# Patient Record
Sex: Male | Born: 1955
Health system: Southern US, Community
[De-identification: ages and names within clinical notes are randomized; demographics above are authoritative.]

## PROBLEM LIST (undated history)

## (undated) DIAGNOSIS — I10 Essential (primary) hypertension: Secondary | ICD-10-CM

## (undated) DIAGNOSIS — E785 Hyperlipidemia, unspecified: Secondary | ICD-10-CM

## (undated) DIAGNOSIS — E559 Vitamin D deficiency, unspecified: Secondary | ICD-10-CM

## (undated) DIAGNOSIS — G40909 Epilepsy, unspecified, not intractable, without status epilepticus: Secondary | ICD-10-CM

## (undated) DIAGNOSIS — J438 Other emphysema: Secondary | ICD-10-CM

## (undated) DIAGNOSIS — F1096 Alcohol use, unspecified with alcohol-induced persisting amnestic disorder: Secondary | ICD-10-CM

## (undated) DIAGNOSIS — T7840XA Allergy, unspecified, initial encounter: Secondary | ICD-10-CM

## (undated) DIAGNOSIS — F1011 Alcohol abuse, in remission: Secondary | ICD-10-CM

## (undated) DIAGNOSIS — G969 Disorder of central nervous system, unspecified: Secondary | ICD-10-CM

## (undated) DIAGNOSIS — E539 Vitamin B deficiency, unspecified: Secondary | ICD-10-CM

## (undated) HISTORY — DX: Other emphysema: J43.8

## (undated) HISTORY — DX: Essential (primary) hypertension: I10

## (undated) HISTORY — DX: Epilepsy, unspecified, not intractable, without status epilepticus: G40.909

## (undated) HISTORY — DX: Alcohol use, unspecified with alcohol-induced persisting amnestic disorder: F10.96

## (undated) HISTORY — DX: Alcohol abuse, in remission: F10.11

## (undated) HISTORY — DX: Disorder of central nervous system, unspecified: G96.9

## (undated) HISTORY — DX: Vitamin D deficiency, unspecified: E55.9

## (undated) HISTORY — DX: Hyperlipidemia, unspecified: E78.5

## (undated) HISTORY — DX: Allergy, unspecified, initial encounter: T78.40XA

## (undated) HISTORY — DX: Vitamin B deficiency, unspecified: E53.9

## (undated) HISTORY — PX: VASECTOMY: SHX75

---

## 2006-07-19 ENCOUNTER — Emergency Department: Payer: Self-pay | Admitting: Emergency Medicine

## 2009-04-12 DIAGNOSIS — I10 Essential (primary) hypertension: Secondary | ICD-10-CM | POA: Insufficient documentation

## 2009-04-12 DIAGNOSIS — J301 Allergic rhinitis due to pollen: Secondary | ICD-10-CM | POA: Insufficient documentation

## 2009-04-12 DIAGNOSIS — J449 Chronic obstructive pulmonary disease, unspecified: Secondary | ICD-10-CM | POA: Insufficient documentation

## 2013-11-21 ENCOUNTER — Inpatient Hospital Stay: Payer: Self-pay | Admitting: Internal Medicine

## 2013-11-21 LAB — CBC WITH DIFFERENTIAL/PLATELET
Basophil #: 0.1 10*3/uL (ref 0.0–0.1)
Basophil %: 0.9 %
Eosinophil #: 0.1 10*3/uL (ref 0.0–0.7)
Eosinophil %: 1.1 %
HCT: 58.6 % — AB (ref 40.0–52.0)
HGB: 19.7 g/dL — AB (ref 13.0–18.0)
Lymphocyte #: 1.4 10*3/uL (ref 1.0–3.6)
Lymphocyte %: 12.1 %
MCH: 32.7 pg (ref 26.0–34.0)
MCHC: 33.6 g/dL (ref 32.0–36.0)
MCV: 97 fL (ref 80–100)
MONO ABS: 1.8 x10 3/mm — AB (ref 0.2–1.0)
Monocyte %: 15.3 %
NEUTROS ABS: 8.4 10*3/uL — AB (ref 1.4–6.5)
NEUTROS PCT: 70.6 %
Platelet: 131 10*3/uL — ABNORMAL LOW (ref 150–440)
RBC: 6.02 10*6/uL — ABNORMAL HIGH (ref 4.40–5.90)
RDW: 12.7 % (ref 11.5–14.5)
WBC: 11.8 10*3/uL — ABNORMAL HIGH (ref 3.8–10.6)

## 2013-11-21 LAB — COMPREHENSIVE METABOLIC PANEL
ALBUMIN: 2.9 g/dL — AB (ref 3.4–5.0)
Alkaline Phosphatase: 115 U/L
Anion Gap: 10 (ref 7–16)
BUN: 6 mg/dL — ABNORMAL LOW (ref 7–18)
Bilirubin,Total: 3.2 mg/dL — ABNORMAL HIGH (ref 0.2–1.0)
CALCIUM: 8.8 mg/dL (ref 8.5–10.1)
Chloride: 95 mmol/L — ABNORMAL LOW (ref 98–107)
Co2: 24 mmol/L (ref 21–32)
Creatinine: 0.71 mg/dL (ref 0.60–1.30)
EGFR (African American): >60
EGFR (Non-African Amer.): >60
Glucose: 121 mg/dL — ABNORMAL HIGH (ref 65–99)
Osmolality: 258 (ref 275–301)
Potassium: 3.7 mmol/L (ref 3.5–5.1)
SGOT(AST): 78 U/L — ABNORMAL HIGH (ref 15–37)
SGPT (ALT): 71 U/L (ref 12–78)
SODIUM: 129 mmol/L — AB (ref 136–145)
Total Protein: 6.3 g/dL — ABNORMAL LOW (ref 6.4–8.2)

## 2013-11-21 LAB — URINALYSIS, COMPLETE
BLOOD: NEGATIVE
Bacteria: NEGATIVE
GLUCOSE, UR: NEGATIVE mg/dL (ref 0–75)
Ketone: NEGATIVE
Leukocyte Esterase: NEGATIVE
Nitrite: NEGATIVE
PH: 5 (ref 4.5–8.0)
Protein: 30
RBC,UR: NONE SEEN /HPF (ref 0–5)
Specific Gravity: 1.017 (ref 1.003–1.030)

## 2013-11-21 LAB — AMMONIA: AMMONIA, PLASMA: 14 umol/L (ref 11–32)

## 2013-11-21 LAB — SEDIMENTATION RATE: Erythrocyte Sed Rate: 1 mm/hr (ref 0–20)

## 2013-11-22 LAB — BASIC METABOLIC PANEL
Anion Gap: 5 — ABNORMAL LOW (ref 7–16)
BUN: 4 mg/dL — ABNORMAL LOW (ref 7–18)
CALCIUM: 7.8 mg/dL — AB (ref 8.5–10.1)
CHLORIDE: 101 mmol/L (ref 98–107)
CO2: 25 mmol/L (ref 21–32)
CREATININE: 0.59 mg/dL — AB (ref 0.60–1.30)
EGFR (African American): >60
EGFR (Non-African Amer.): >60
Glucose: 97 mg/dL (ref 65–99)
Osmolality: 259 (ref 275–301)
Potassium: 3.1 mmol/L — ABNORMAL LOW (ref 3.5–5.1)
SODIUM: 131 mmol/L — AB (ref 136–145)

## 2013-11-22 LAB — CBC WITH DIFFERENTIAL/PLATELET
BASOS ABS: 0.1 10*3/uL (ref 0.0–0.1)
Basophil %: 1.1 %
Eosinophil #: 0.1 10*3/uL (ref 0.0–0.7)
Eosinophil %: 1.4 %
HCT: 48.4 % (ref 40.0–52.0)
HGB: 16.7 g/dL (ref 13.0–18.0)
Lymphocyte #: 1.4 10*3/uL (ref 1.0–3.6)
Lymphocyte %: 15.8 %
MCH: 33.9 pg (ref 26.0–34.0)
MCHC: 34.5 g/dL (ref 32.0–36.0)
MCV: 98 fL (ref 80–100)
MONOS PCT: 13.4 %
Monocyte #: 1.2 x10 3/mm — ABNORMAL HIGH (ref 0.2–1.0)
NEUTROS ABS: 6.2 10*3/uL (ref 1.4–6.5)
Neutrophil %: 68.3 %
Platelet: 110 10*3/uL — ABNORMAL LOW (ref 150–440)
RBC: 4.93 10*6/uL (ref 4.40–5.90)
RDW: 12.8 % (ref 11.5–14.5)
WBC: 9.1 10*3/uL (ref 3.8–10.6)

## 2013-11-22 LAB — LIPID PANEL
Cholesterol: 108 mg/dL (ref 0–200)
HDL Cholesterol: 56 mg/dL (ref 40–60)
LDL CHOLESTEROL, CALC: 41 mg/dL (ref 0–100)
TRIGLYCERIDES: 54 mg/dL (ref 0–200)
VLDL CHOLESTEROL, CALC: 11 mg/dL (ref 5–40)

## 2013-11-22 LAB — MAGNESIUM: Magnesium: 1.5 mg/dL — ABNORMAL LOW

## 2013-11-23 LAB — BASIC METABOLIC PANEL
ANION GAP: 7 (ref 7–16)
BUN: 3 mg/dL — AB (ref 7–18)
CO2: 25 mmol/L (ref 21–32)
Calcium, Total: 8.2 mg/dL — ABNORMAL LOW (ref 8.5–10.1)
Chloride: 106 mmol/L (ref 98–107)
Creatinine: 0.59 mg/dL — ABNORMAL LOW (ref 0.60–1.30)
EGFR (African American): >60
GLUCOSE: 83 mg/dL (ref 65–99)
OSMOLALITY: 271 (ref 275–301)
Potassium: 3.3 mmol/L — ABNORMAL LOW (ref 3.5–5.1)
SODIUM: 138 mmol/L (ref 136–145)

## 2013-11-23 LAB — PLATELET COUNT: PLATELETS: 109 10*3/uL — AB (ref 150–440)

## 2013-11-23 LAB — MAGNESIUM: MAGNESIUM: 1.6 mg/dL — AB

## 2013-11-25 LAB — BASIC METABOLIC PANEL
Anion Gap: 6 — ABNORMAL LOW (ref 7–16)
BUN: 3 mg/dL — AB (ref 7–18)
CREATININE: 0.63 mg/dL (ref 0.60–1.30)
Calcium, Total: 8.3 mg/dL — ABNORMAL LOW (ref 8.5–10.1)
Chloride: 105 mmol/L (ref 98–107)
Co2: 27 mmol/L (ref 21–32)
EGFR (African American): >60
EGFR (Non-African Amer.): >60
Glucose: 87 mg/dL (ref 65–99)
Osmolality: 272 (ref 275–301)
Potassium: 3.5 mmol/L (ref 3.5–5.1)
Sodium: 138 mmol/L (ref 136–145)

## 2013-11-25 LAB — MAGNESIUM: Magnesium: 1.8 mg/dL

## 2013-11-26 LAB — CBC WITH DIFFERENTIAL/PLATELET
BASOS PCT: 1.4 %
Basophil #: 0.1 10*3/uL (ref 0.0–0.1)
Eosinophil #: 0.3 10*3/uL (ref 0.0–0.7)
Eosinophil %: 3.3 %
HCT: 47.6 % (ref 40.0–52.0)
HGB: 16.5 g/dL (ref 13.0–18.0)
LYMPHS ABS: 1.3 10*3/uL (ref 1.0–3.6)
Lymphocyte %: 13.3 %
MCH: 34 pg (ref 26.0–34.0)
MCHC: 34.6 g/dL (ref 32.0–36.0)
MCV: 98 fL (ref 80–100)
MONOS PCT: 15.4 %
Monocyte #: 1.5 x10 3/mm — ABNORMAL HIGH (ref 0.2–1.0)
Neutrophil #: 6.4 10*3/uL (ref 1.4–6.5)
Neutrophil %: 66.6 %
RBC: 4.84 10*6/uL (ref 4.40–5.90)
RDW: 12.9 % (ref 11.5–14.5)
WBC: 9.6 10*3/uL (ref 3.8–10.6)

## 2013-11-26 LAB — PLATELET COUNT: PLATELETS: 158 10*3/uL (ref 150–440)

## 2013-11-27 LAB — AMMONIA: Ammonia, Plasma: 19 mcmol/L (ref 11–32)

## 2013-11-27 LAB — HEPATIC FUNCTION PANEL A (ARMC)
ALBUMIN: 2.2 g/dL — AB (ref 3.4–5.0)
ALT: 39 U/L (ref 12–78)
AST: 24 U/L (ref 15–37)
Alkaline Phosphatase: 77 U/L
Bilirubin, Direct: 0.2 mg/dL (ref 0.00–0.20)
Bilirubin,Total: 0.5 mg/dL (ref 0.2–1.0)
TOTAL PROTEIN: 5.1 g/dL — AB (ref 6.4–8.2)

## 2013-11-27 LAB — FOLATE: Folic Acid: 17.9 ng/mL (ref 3.1–100.0)

## 2013-11-27 LAB — TSH: Thyroid Stimulating Horm: 3.07 u[IU]/mL

## 2013-11-28 LAB — CREATININE, SERUM
CREATININE: 0.68 mg/dL (ref 0.60–1.30)
EGFR (African American): >60

## 2014-01-10 ENCOUNTER — Ambulatory Visit: Payer: Self-pay | Admitting: Neurology

## 2014-01-10 ENCOUNTER — Emergency Department: Payer: Self-pay | Admitting: Emergency Medicine

## 2014-01-10 LAB — CBC
HCT: 47.3 % (ref 40.0–52.0)
HGB: 16.1 g/dL (ref 13.0–18.0)
MCH: 30.9 pg (ref 26.0–34.0)
MCHC: 34.1 g/dL (ref 32.0–36.0)
MCV: 91 fL (ref 80–100)
Platelet: 258 10*3/uL (ref 150–440)
RBC: 5.22 10*6/uL (ref 4.40–5.90)
RDW: 13.1 % (ref 11.5–14.5)
WBC: 8.7 10*3/uL (ref 3.8–10.6)

## 2014-01-10 LAB — MAGNESIUM: MAGNESIUM: 1.7 mg/dL — AB

## 2014-01-10 LAB — URINALYSIS, COMPLETE
BACTERIA: NONE SEEN
BLOOD: NEGATIVE
Bilirubin,UR: NEGATIVE
Glucose,UR: NEGATIVE mg/dL (ref 0–75)
Ketone: NEGATIVE
Leukocyte Esterase: NEGATIVE
Nitrite: NEGATIVE
Ph: 6 (ref 4.5–8.0)
Protein: NEGATIVE
RBC,UR: <1 /HPF (ref 0–5)
Specific Gravity: 1.002 (ref 1.003–1.030)
Squamous Epithelial: NONE SEEN
WBC UR: 1 /HPF (ref 0–5)

## 2014-01-10 LAB — TSH: THYROID STIMULATING HORM: 3.71 u[IU]/mL

## 2014-01-10 LAB — COMPREHENSIVE METABOLIC PANEL
ALK PHOS: 71 U/L
ALT: 21 U/L (ref 12–78)
ANION GAP: 10 (ref 7–16)
AST: 16 U/L (ref 15–37)
Albumin: 3.4 g/dL (ref 3.4–5.0)
BUN: 2 mg/dL — AB (ref 7–18)
Bilirubin,Total: 0.7 mg/dL (ref 0.2–1.0)
CALCIUM: 9.1 mg/dL (ref 8.5–10.1)
CHLORIDE: 105 mmol/L (ref 98–107)
Co2: 25 mmol/L (ref 21–32)
Creatinine: 0.8 mg/dL (ref 0.60–1.30)
EGFR (African American): >60
Glucose: 97 mg/dL (ref 65–99)
Osmolality: 276 (ref 275–301)
Potassium: 3.6 mmol/L (ref 3.5–5.1)
Sodium: 140 mmol/L (ref 136–145)
TOTAL PROTEIN: 6.8 g/dL (ref 6.4–8.2)

## 2014-01-10 LAB — TROPONIN I: Troponin-I: <0.02 ng/mL

## 2014-01-10 LAB — PHOSPHORUS: Phosphorus: 3.4 mg/dL (ref 2.5–4.9)

## 2014-01-10 LAB — AMMONIA: Ammonia, Plasma: 25 mcmol/L (ref 11–32)

## 2014-07-05 LAB — PSA: PSA: NORMAL

## 2014-07-05 LAB — LIPID PANEL
Cholesterol: 197 mg/dL (ref 0–200)
HDL: 39 mg/dL (ref 35–70)
LDL Cholesterol: 131 mg/dL
TRIGLYCERIDES: 133 mg/dL (ref 40–160)

## 2014-12-27 IMAGING — CT CT HEAD WITHOUT CONTRAST
1 series · 16 of 30 positions shown, 20 images · non-contrast
Comparison: 11/25/2013 MR.  11/21/2013 CT.

CLINICAL DATA: Altered mental status. Thiamine deficiency. High
blood pressure.

EXAM:
CT HEAD WITHOUT CONTRAST
TECHNIQUE: Contiguous axial images were obtained from the base of the skull
through the vertex without intravenous contrast.

[Series 2: head wo · axial · 0.41mm/px · z∈[+454,+589]mm · 16 of 34 slices shown, 20 images]
[im 2/34  brain]
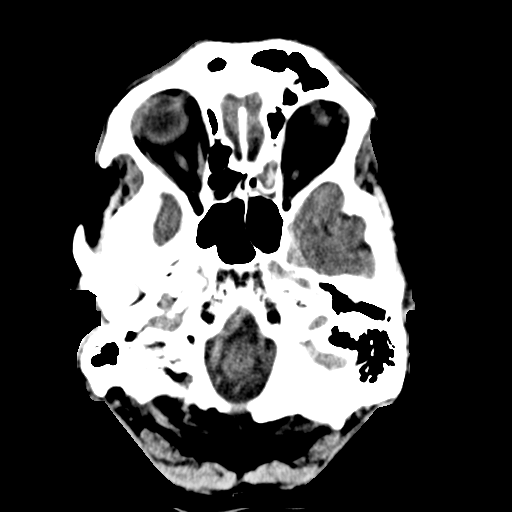
[im 2/34  bone]
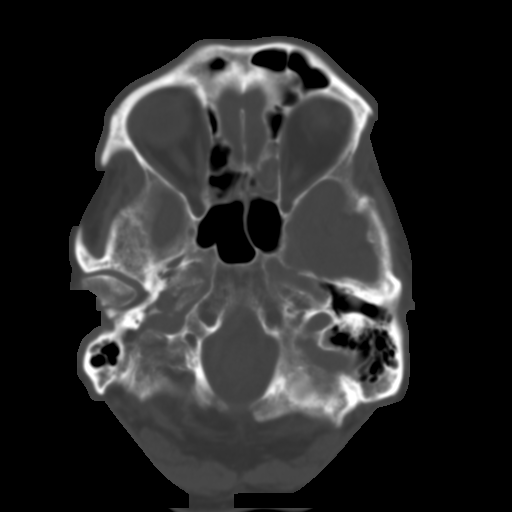
[im 4/34  brain]
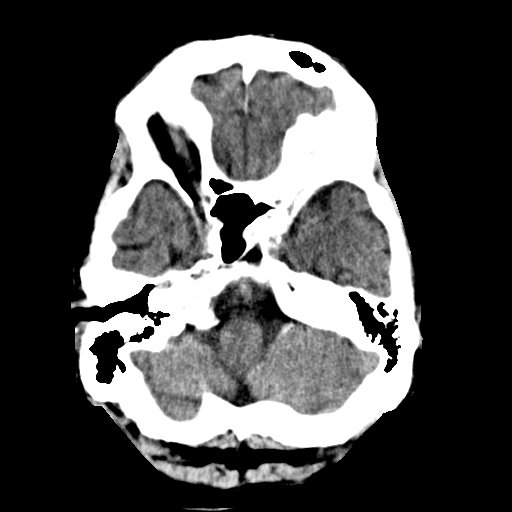
[im 6/34  brain]
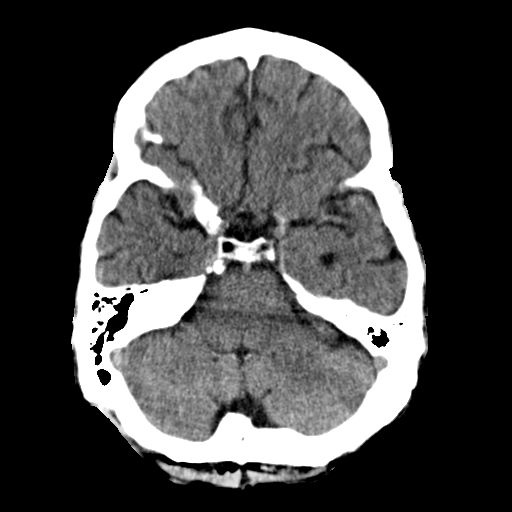
[im 8/34  brain]
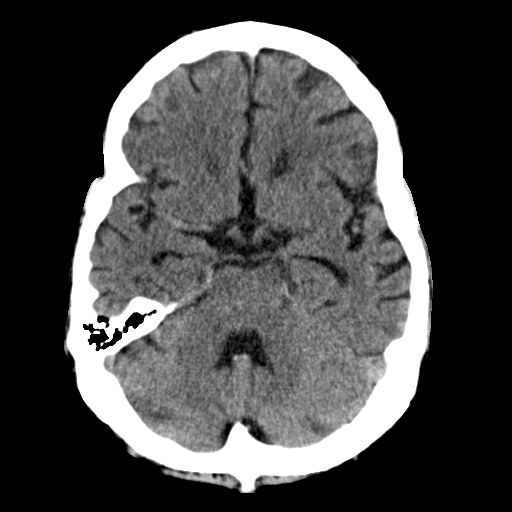
[im 10/34  brain]
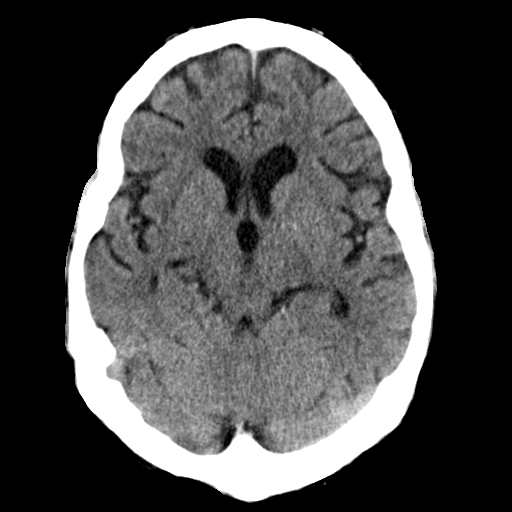
[im 10/34  bone]
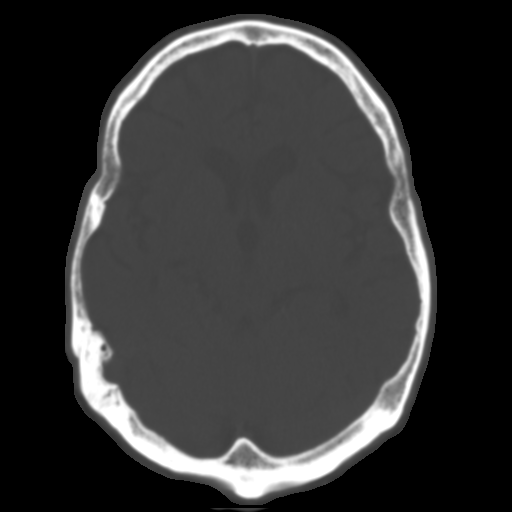
[im 12/34  brain]
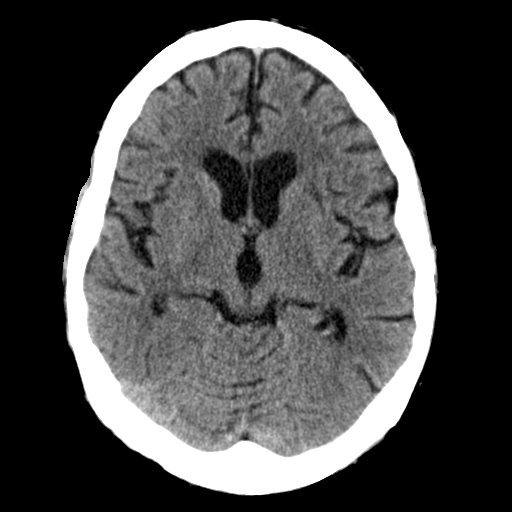
[im 14/34  brain]
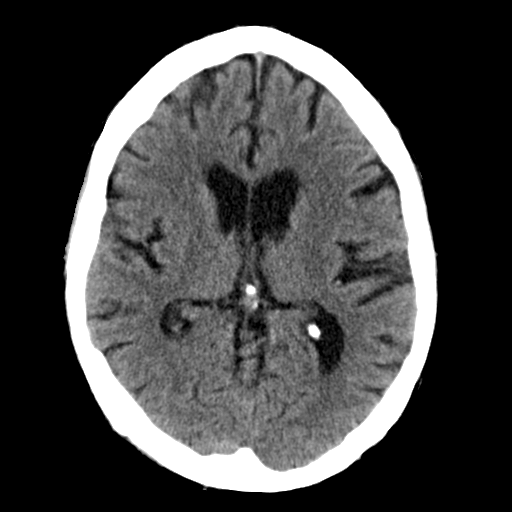
[im 16/34  brain]
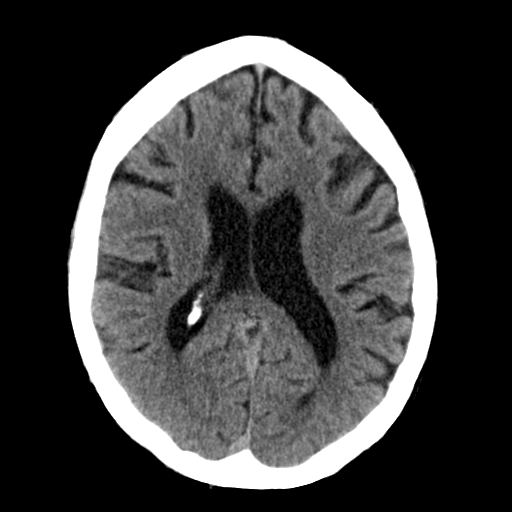
[im 18/34  brain]
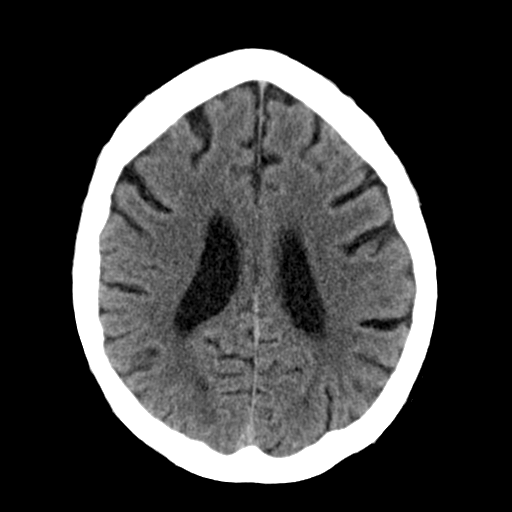
[im 18/34  bone]
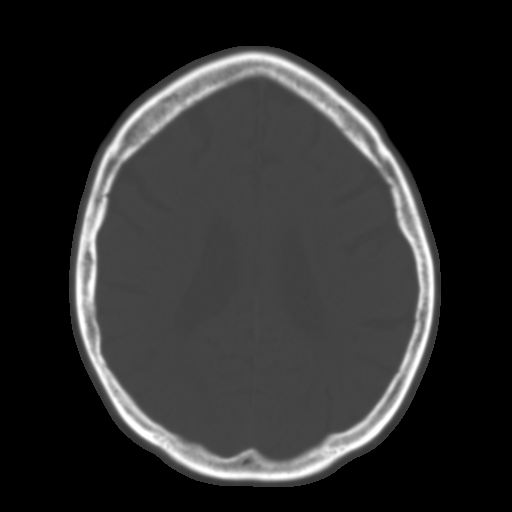
[im 20/34  brain]
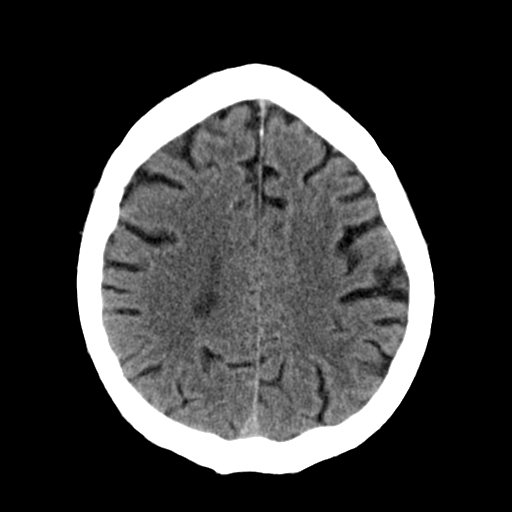
[im 22/34  brain]
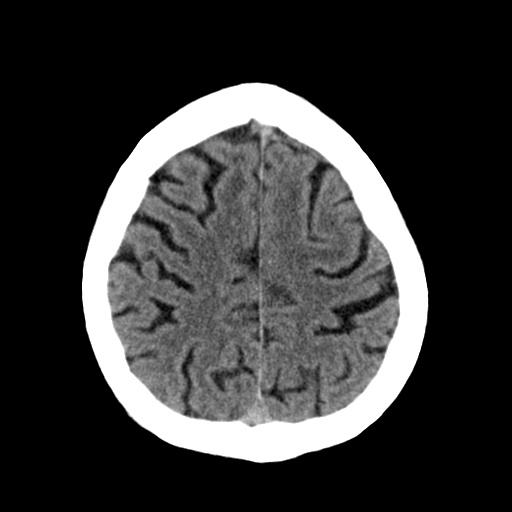
[im 24/34  brain]
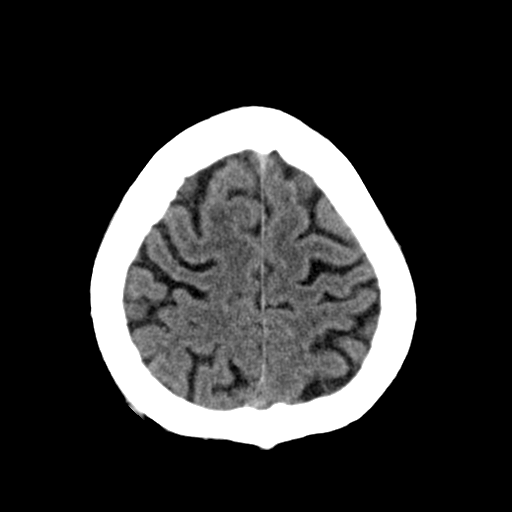
[im 26/34  brain]
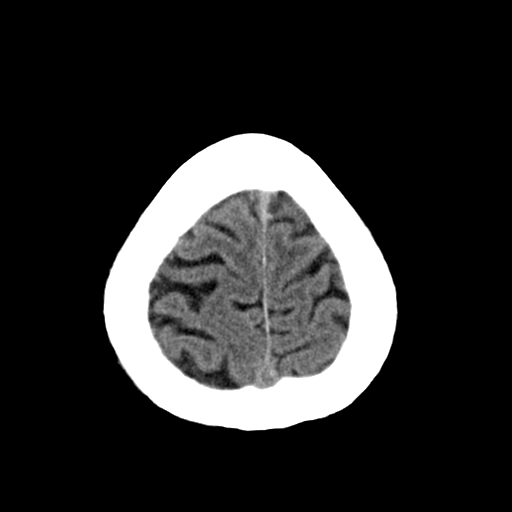
[im 26/34  bone]
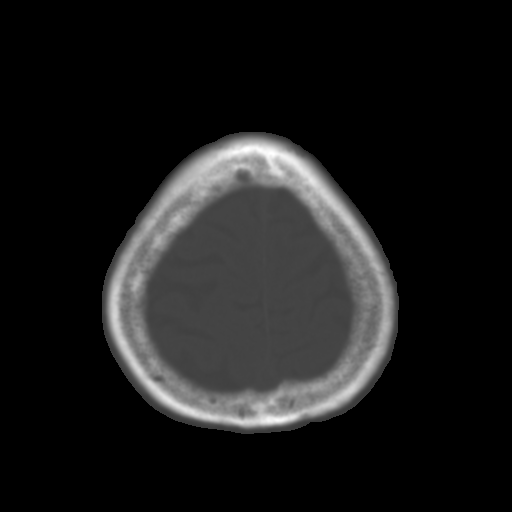
[im 28/34  brain]
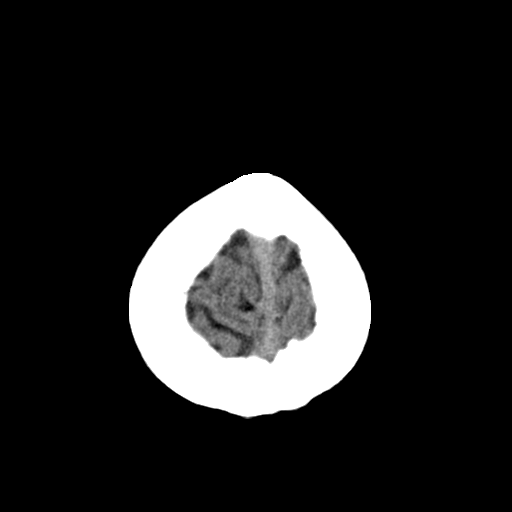
[im 30/34  brain]
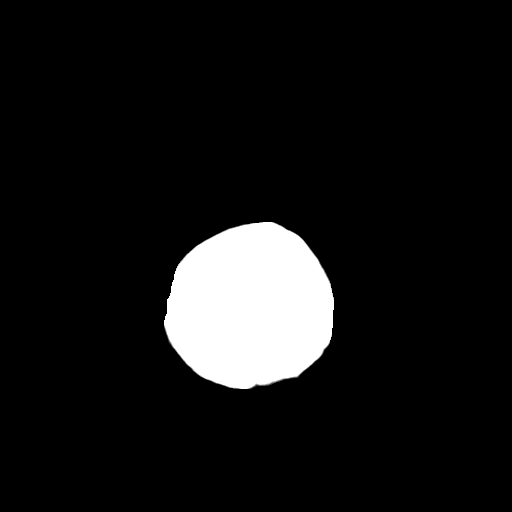
[im 32/34  brain]
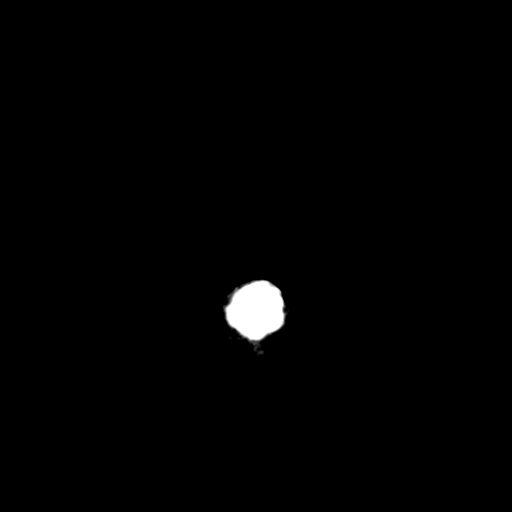

[16 of 30 positions shown; findings below may reference images not displayed]

FINDINGS: No intracranial hemorrhage.

No CT evidence of large acute infarct.

No intracranial mass lesion noted on this unenhanced exam.

Mild nonspecific white matter type changes.

Mild atrophy without hydrocephalus.

Partial opacification/ mucosal thickening ethmoid sinus air cells.

No CT findings of Wernicke's encephalopathy. This is better detected
by MR imaging.
IMPRESSION: Mild nonspecific white matter type changes.  Please see above.

## 2015-02-04 NOTE — Consult Note (Signed)
Referring Physician:  Remer Macho   Primary Care Physician:  Idelle Crouch [facility], [address] [place], [place] [address], [place] [phone]  Reason for Consult: Admit Date: 21-Nov-2013  Chief Complaint: encephalopathy  Reason for Consult: encephalopathy   History of Present Illness: History of Present Illness:   The patient is a 59 year old male with a history of hypertension and tobacco abuse, who presents with a 3 to 4 day history of intractable nausea, vomiting, and now mental status changes. He is confused. Poor memory of acute onset. Brought to the Emergency Room, where the patient's initial head CT was unremarkable. He was noted to be polycythemic and dehydrated, with hyponatremia. He is afebrile. He is now admitted for further evaluation.  friend told me that he makes things up (e.g. described an episode how he [place] - which in reality did not happen, pt thinks that he still works - when he retired more than 6 months ago.) walks wobbly. heavy alcohol use as above. MEDICAL HISTORY: Benign hypertension.  Tobacco abuse.  HISTORY: The patient is married. Smokes at least 1 pack per day. No history of alcohol abuse.  HISTORY: Positive for stroke, hypertension, coronary artery disease, and dementia.    ROS:  General fatigue    HEENT no complaints    Lungs no complaints    Cardiac no complaints    GI nausea/vomiting    GU no complaints    Musculoskeletal muscle ache    Extremities no complaints    Skin no complaints    Neuro memory issues    Endocrine no complaints    Psych no complaints    Past Medical/Surgical Hx:  depression: per chart notes  alcohol dependence: per chart notes  Hypertension:   Home Medications: Medication Instructions Last Modified Date/Time  verapamil 240 mg/12 hours oral tablet, extended release 1 tab(s) orally once a day (in the morning) 08-Feb-15 17:55  Fish Oil 1000 mg oral capsule 1 cap(s)  orally 1 to 2 times a day 08-Feb-15 17:55   Allergies:  No Known Allergies:   Vital Signs: **Vital Signs.:   16-Feb-15 12:55  Vital Signs Type Q 4hr  Temperature Temperature (F) 97.9  Celsius 36.6  Pulse Pulse 86  Respirations Respirations 20  Systolic BP Systolic BP 122  Diastolic BP (mmHg) Diastolic BP (mmHg) 73  Mean BP 89  Pulse Ox % Pulse Ox % 92  Pulse Ox Activity Level  At rest  Oxygen Delivery Room Air/ 21 %   EXAM: General Exam Patient looks appropriate of age, well built, nourished and appropriately groomed in hospital clothes.   Cardiovascular Exam: S1, S2 heart sounds present Carotid exam revealed no bruit Lung exam was clear to auscultation belly soft  Neurological Exam      Mental Status:      Alert, not oriented (03/26/2011), wednesday, Obama - bush - ?, 3/3 - 0/3, couldn't count months of the year backward, thought my exam tools were electrician tools.   Attention span and concentration seemed decreased.     Intact naming, repetition, comprehension.       Followed 2 step commands - no dysarthria +ve palmomental, palmer and visual grasp reflex.       Cranial Nerves:      Olfactory and vagus nerves not are examined      Visual fields were full      Pupils were equal, round and reactive to light and accommodation  Extra-ocular movements were slow, sccadic, end gaze nystagmus      Facial sensations are normal      Face is symmetric      Finger rub was heard symmetric in both ears      Palate and uvular movements are normal and oral sensations are OK      Neck muscle strength and shoulder shrug is normal      Tongue protrusion and uvular elevation are normal       Motor Exam:      Tone is normal in all extremities      Muscle strength in all extremities is 5/5. bil asterixis.       Deep Tendon Reflexes:      symmetric 2 +      Right Toes are down going,  Left Toes are down going            Sensory Exam:      Sensations were intact to light touch  in all extremities           Co-ordination:      Finger to nose showed mild inco-ordination.            Gait:      Gait and station seemed OK..  Lab Results:  Thyroid:  14-Feb-15 12:33   Thyroid Stimulating Hormone 3.07 (0.45-4.50 (International Unit)  ----------------------- Pregnant patients have  different reference  ranges for TSH:  - - - - - - - - - -  Pregnant, first trimetser:  0.36 - 2.50 uIU/mL)  Hepatic:  14-Feb-15 04:36   Bilirubin, Total 0.5  Bilirubin, Direct 0.20 (Result(s) reported on 27 Nov 2013 at 05:26AM.)  Alkaline Phosphatase 77 (45-117 NOTE: New Reference Range 09/03/13)  SGPT (ALT) 39  SGOT (AST) 24  Total Protein, Serum  5.1  Albumin, Serum  2.2  General Ref:  14-Feb-15 04:36   Hepatitis Panel A, B, C ========== TEST NAME ==========  ========= RESULTS =========  = REFERENCE RANGE =  HEPATITIS PANEL A, B, C  HP5+HAVIgM+HBcIgM Hep A Ab, IgM                   [   Negative             ]          Negative Hep A Ab, Total                 [   Negative             ]          Negative HBsAg Screen                    [   Negative             ]          Negative Hep B Core Ab, IgM              [   Negative             ]          Negative Hep B Core Ab, Tot              [   Negative             ]         Negative Hep B Surface Ab, Qual          [  Non Reactive         ]                                                Non Reactive: Inconsistent with immunity,                                            less than 10 mIU/mL            Reactive:     Consistent with immunity,                                            greater than 9.9 mIU/mL HCV Ab                          [   <0.1 s/co ratio      ]           0.0-0.9 Comment:                        [   Final Report    ]                   Non reactive HCV antibody screen is consistent with no HCV infection, unless recent infection is suspected or other evidence exists to indicate HCV infection.                [facility]            No: [id number]          [address], [place], [place] [address]           Lindon Romp, MD         [phone]   Result(s) reported on 28 Nov 2013 at 10:49PM.  Routine Chem:  09-Feb-15 04:26   Cholesterol, Serum 108  Triglycerides, Serum 54  HDL (INHOUSE) 56  VLDL Cholesterol Calculated 11  LDL Cholesterol Calculated 41 (Result(s) reported on 22 Nov 2013 at 05:13AM.)  12-Feb-15 04:09   Glucose, Serum 87  BUN  3  Sodium, Serum 138  Potassium, Serum 3.5  Chloride, Serum 105  CO2, Serum 27  Calcium (Total), Serum  8.3  Anion Gap  6  Osmolality (calc) 272  Magnesium, Serum 1.8 (1.8-2.4 THERAPEUTIC RANGE: 4-7 mg/dL TOXIC: > 10 mg/dL  -----------------------)  14-Feb-15 12:33   Ammonia, Plasma 19 (Result(s) reported on 27 Nov 2013 at 07:12RF.)  Folic Acid, Serum 75.8 (Result(s) reported on 27 Nov 2013 at 01:23PM.)  15-Feb-15 06:02   Creatinine (comp) 0.68  eGFR (African American) >60  eGFR (Non-African American) >60 (eGFR values <83m/min/1.73 m2 may be an indication of chronic kidney disease (CKD). Calculated eGFR is useful in patients with stable renal function. The eGFR calculation will not be reliable in acutely ill patients when serum creatinine is changing rapidly. It is not useful in  patients on dialysis. The eGFR calculation may not be applicable to patients at the low and high extremes of body sizes, pregnant women, and vegetarians.)  Routine UA:  08-Feb-15 11:09  Color (UA) Amber  Clarity (UA) Clear  Glucose (UA) Negative  Bilirubin (UA) 1+  Ketones (UA) Negative  Specific Gravity (UA) 1.017  Blood (UA) Negative  pH (UA) 5.0  Protein (UA) 30 mg/dL  Nitrite (UA) Negative  Leukocyte Esterase (UA) Negative (Result(s) reported on 21 Nov 2013 at 11:56AM.)  RBC (UA) NONE SEEN  WBC (UA) 0-5  Bacteria (UA) NEGATIVE  Epithelial Cells (UA) 0-5 / HPF  Mucous (UA) PRESENT  Hyaline Cast (UA) 0-5 / LPF  Result(s) reported on  21 Nov 2013 at 11:56AM.  Routine Hem:  08-Feb-15 16:07   Erythrocyte Sed Rate 1 (Result(s) reported on 21 Nov 2013 at 04:54PM.)  13-Feb-15 04:53   WBC (CBC) 9.6  RBC (CBC) 4.84  Hemoglobin (CBC) 16.5  Hematocrit (CBC) 47.6  MCV 98  MCH 34.0  MCHC 34.6  RDW 12.9  Neutrophil % 66.6  Lymphocyte % 13.3  Monocyte % 15.4  Eosinophil % 3.3  Basophil % 1.4  Neutrophil # 6.4  Lymphocyte # 1.3  Monocyte #  1.5  Eosinophil # 0.3  Basophil # 0.1 (Result(s) reported on 26 Nov 2013 at 05:47AM.)  Platelet Count (CBC) 158 (Result(s) reported on 26 Nov 2013 at 05:47AM.)   Radiology Results: MRI:    12-Feb-15 19:37, MRI Brain Without Contrast  MRI Brain Without Contrast   REASON FOR EXAM:    slurred speech, new dysphagia  COMMENTS:       PROCEDURE: MR  - MR BRAIN WO CONTRAST  - Nov 25 2013  7:37PM     CLINICAL DATA:  59 year old male slurred speech, new dysphagia.  Confusion. Recent viral illness. Initial encounter.    EXAM:  MRI HEAD WITHOUT CONTRAST    TECHNIQUE:  Multiplanar, multiecho pulse sequences of the brain and surrounding  structures were obtained without intravenous contrast.  COMPARISON:  Head CT without contrast 11/21/2013.    FINDINGS:  Cerebral volume is within normal limits for age. No restricted  diffusion to suggest acute infarction. No midline shift, mass  effect, evidence of mass lesion, ventriculomegaly, extra-axial  collection or acute intracranial hemorrhage. Cervicomedullary  junction and pituitary are within normal limits. Negative visualized  cervical spine. Major intracranial vascular flow voids are  preserved. Dominant distal right vertebral artery.    Minimal to mild for age cerebral white matter T2 and FLAIR  hyperintensity, mostly subcortical and in a nonspecific  configuration. No cortical encephalomalacia. Deep gray matter  nuclei, brainstem and cerebellum are within normal limits.  Visible internal auditory structures appear normal. Small  volume  right mastoid effusion. Trace retained secretions in the  nasopharynx. Mild to moderate paranasal sinus fluid/ mucosal  thickening.    Visualized orbit soft tissues are within normal limits. Normal bone  marrow signal. Visualized scalp soft tissues are within normal  limits.     IMPRESSION:  1. No acute intracranial abnormality. Mild for age nonspecific white  matter signal changes.  2. Paranasal sinus inflammatory changes and mild right mastoid  effusion.  Electronically Signed    By: Lars Pinks M.D.    On: 02/12/201520:04         Verified By: Gwenyth Bender. Nevada Crane, M.D.,  CT:    08-Feb-15 14:16, CT Head Without Contrast  CT Head Without Contrast   REASON FOR EXAM:    confusion  COMMENTS:       PROCEDURE: CT  - CT HEAD WITHOUT CONTRAST  - Nov 21 2013  2:16PM     CLINICAL DATA:  Confusion.  Dizziness.    EXAM:  CT HEAD WITHOUT CONTRAST    TECHNIQUE:  Contiguous axial images were obtained from the base of the skull  through the vertex without intravenous contrast.    COMPARISON:  None.  FINDINGS:  No acute intracranial abnormality. Specifically, no hemorrhage,  hydrocephalus, mass lesion, acute infarction, or significant  intracranial injury.No acute calvarial abnormality.    Mild mucosal thickening in the ethmoid air cells. Mastoid air cells  are clear. Orbital soft tissues are unremarkable.     IMPRESSION:  No acute intracranial abnormality.      Electronically Signed    By: Rolm Baptise M.D.    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 (confabulation, asterixis, ataxia, nystagmus, mild dysphagia, mild dysarthria). (wernicke's encephalopathy)continue thiamine 100 mg /dayother labs reviewed.MRI brain - nonspecific WM changes due to age and vascular risk  factors. Please avoid anticholinergics, older generation anti-psychotics, anti-histaminics etc.and hypercarbia can also cause acute encephalopathy. phamacological measures/instructions for nursing staff:Provide frequent orientation/reorientation (date, day, month on notice board)Room should be well lit during day time and turn off lights at night time.Avoid unnecessary interruption to patient's sleep.Turn off TV, if patient is not watching and specially at night time.Avoid noisy environment to facilitate sleep/rest.Remove Foley catheter/NG tube as soon as possible. will check out to Dr. Melrose Nakayama   Electronic Signatures: Ray Church (MD)  (Signed 16-Feb-15 16:51)  Authored: REFERRING PHYSICIAN, Primary Care Physician, Consult, History of Present Illness, Review of Systems, PAST MEDICAL/SURGICAL HISTORY, HOME MEDICATIONS, ALLERGIES, NURSING VITAL SIGNS, Physical Exam-, LAB RESULTS, RADIOLOGY RESULTS, Recommendations   Last Updated: 16-Feb-15 16:51 by Ray Church (MD)

## 2015-02-04 NOTE — Consult Note (Signed)
Pt improved, eating better, still not oriented to place and city but knows his son and his name.  The son reports the patient was walking better, motor skills betterNH3 normal, albumin 2.2.  Recommend repeat MBSS next week when (hopefully) mental status improved.  EGD can be done day before discharge.  Electronic Signatures: Manya Silvas (MD)  (Signed on 14-Feb-15 18:10)  Authored  Last Updated: 14-Feb-15 18:10 by Manya Silvas (MD)

## 2015-02-04 NOTE — Consult Note (Signed)
Brief Consult Note: Diagnosis: Alcohol dependence, alcohol withdrawal delirium.   Patient was seen by consultant.   Consult note dictated.   Recommend further assessment or treatment.   Comments: Mr. Christian Griffith has a h/o drinking. Since he retired 7 months ago he has been drinking over twelve beers a day as reported by his wife. He is utterly and pleasantly confused and unable to provide any information. He thinks he is in [place], still employed and does not know his age. He is on CIWA protocol and standing librium. He is not getting any ativan per CIWA.   PLAN: 1. We will quickly taper Lubrium off.  2. The wife is concerned about confusion. She works so the patient will not be safe at home. I reasurred her that confusion would resolve.  3. I will follow up.  Electronic Signatures: Orson Slick (MD)  (Signed 10-Feb-15 14:29)  Authored: Brief Consult Note   Last Updated: 10-Feb-15 14:29 by Orson Slick (MD)

## 2015-02-04 NOTE — H&P (Signed)
PATIENT NAME:  Christian Griffith, PAT MR#:  [id number] DATE OF BIRTH:  [birth year]  DATE OF ADMISSION:  11/21/2013  REFERRING PHYSICIAN: Dr. Karma Greaser  FAMILY PHYSICIAN: Dr. Rutherford Nail  REASON FOR ADMISSION: Altered mental status.   HISTORY OF PRESENT ILLNESS: The patient is a 59 year old male with a history of hypertension and tobacco abuse, who presents with a 3 to 4 day history of intractable nausea, vomiting, and now mental status changes. He is confused. Poor memory of acute onset. Brought to the Emergency Room, where the patient's initial head CT was unremarkable. He was noted to be polycythemic and dehydrated, with hyponatremia. He is afebrile. He is now admitted for further evaluation.   PAST MEDICAL HISTORY: 1.  Benign hypertension.  2.  Tobacco abuse.   MEDICATIONS: Verapamil SR 240 mg p.o. daily.   ALLERGIES: No known drug allergies.   SOCIAL HISTORY: The patient is married. Smokes at least 1 pack per day. No history of alcohol abuse.   FAMILY HISTORY: Positive for stroke, hypertension, coronary artery disease, and dementia.   REVIEW OF SYSTEMS:    CONSTITUTIONAL: No fever or change in weight.  EYES: No blurred or double vision. No glaucoma.  ENT: No tinnitus or hearing loss. No nasal discharge or bleeding. No difficulty swallowing.  RESPIRATORY: No cough or wheezing. Denies hemoptysis.  CARDIOVASCULAR: No chest pain or orthopnea. No palpitations or syncope.  GASTROINTESTINAL: Nausea, vomiting, but no diarrhea. No change in bowel habits.  GENITOURINARY: No dysuria or hematuria. No incontinence.  ENDOCRINE: No polyuria or polydipsia. No heat or cold intolerance.  HEMATOLOGIC: The patient denies anemia, easy bruising or bleeding.  LYMPHATIC: No swollen glands.  MUSCULOSKELETAL: The patient denies pain in his neck, back, shoulders, knees, or hips. No gout.  NEUROLOGIC: No numbness or migraines. Denies stroke or seizures.  PSYCHIATRIC: The patient denies anxiety, insomnia, or  depression.   PHYSICAL EXAMINATION: GENERAL: The patient is in no acute distress.  VITAL SIGNS: Remarkable for blood pressure of 134/65, heart rate 94, respiratory rate of 20, temperature of 97.4, satting 96% on room air.  HEENT: Normocephalic, atraumatic. Pupils equally round and reactive to light and accommodation. Extraocular movements are intact. Sclerae not icteric. Conjunctivae are clear.  Oropharynx is clear.  NECK: Supple, without JVD or bruits. No adenopathy or thyromegaly is noted.  LUNGS: Clear to auscultation and percussion, without wheezes, rales, or rhonchi. No dullness. Respiratory effort is normal.  CARDIAC EXAM: Regular rate and rhythm with a normal S1, S2. No significant rubs, murmurs, or gallops. PMI is nondisplaced. Chest wall is nontender.  ABDOMEN: Soft, nontender, with normoactive bowel sounds. No organomegaly or masses were appreciated. No hernias or bruits were noted.  EXTREMITIES: Without clubbing, cyanosis, or edema. Pulses were 2+ bilaterally.  SKIN: Warm and dry, without rash or lesions.  NEUROLOGIC EXAM: Revealed cranial nerves II through XII were grossly intact. Some horizontal nystagmus was present. Sensory and motor exams were nonfocal.  PSYCHIATRIC EXAM: Revealed patient was alert to person, but not to time or place. He could recognize family members, but answered questions inconsistently.   LABORATORY DATA: Head CT revealed no acute intracranial abnormality. Chest x-ray was unremarkable. Serum ammonia was 14. Glucose 121, with a BUN of 6, creatinine of 0.71, and a sodium of 129. GFR greater than 60. White count was 11.8, with a hemoglobin of 19.7, platelet count of 131. Urinalysis unremarkable.   ASSESSMENT: 1.  Altered mental status.  2.  Polycythemia.  3.  Tobacco abuse.  4.  Hyponatremia.  5.  Dehydration.  6.  Nausea, vomiting.   PLAN: The patient will be admitted to the floor with IV fluids and empiric IV antibiotics. Will start aspirin and Lovenox.  Will check carotid Dopplers. Will also perform MRI of the brain. NicoDerm patch now. Continue verapamil. Neuro checks q. 4 hours. Follow up routine labs in the morning. Will check a lipid profile. Further treatment and evaluation will depend upon the patient's progress.   Total time spent on this patient was 45 minutes.      ____________________________ Leonie Douglas Doy Hutching, MD jds:mr D: 11/21/2013 16:19:36 ET T: 11/21/2013 19:00:19 ET JOB#: [id number]  cc: Leonie Douglas. Doy Hutching, MD, <Dictator> Ashok Norris, MD  March Joos Lennice Sites MD ELECTRONICALLY SIGNED 11/21/2013 19:47

## 2015-02-04 NOTE — Consult Note (Signed)
Brief Consult Note: Diagnosis: Alcohol dependence, alcohol withdrawal delirium.   Patient was seen by consultant.   Consult note dictated.   Recommend further assessment or treatment.   Discussed with Attending MD.   Comments: Christian Griffith has a h/o drinking. Since he retired 7 months ago he has been drinking over twelve beers a day as reported by his wife. We discontinued Librium. He is still on CIWA protocol but did not require any Ativan in the past 2 days. He had an episode of agitation yesterday am and was given Geaodon. He was asleep for the rest of the day. I tried to see him twice yesterday.  This morning he is again somnolent but his daughter reports that he gets up to the bathroom and eats his meals. Very hard to awake today. Feels "tired". Speech slurred hard to understand, in great contrast to gregarious presentation on Tuesday. There are concerns of dysphagia and stroke.   PLAN: 1. Please continue CIWA protocol. VS are stable.   2.Somnolence is unlikely related to alcohol at this point.  3. Will folow along.  Electronic Signatures: Orson Slick (MD)  (Signed 12-Feb-15 12:20)  Authored: Brief Consult Note   Last Updated: 12-Feb-15 12:20 by Orson Slick (MD)

## 2015-02-04 NOTE — Consult Note (Signed)
PATIENT NAME:  Christian Griffith, Christian Griffith MR#:  [id number] DATE OF BIRTH:  07/02/1956  DATE OF CONSULTATION:  11/26/2013  REFERRING PHYSICIAN:  Dr. Darvin Neighbours.  CONSULTING PHYSICIAN:  Keith Rake, MD;  Janalyn Harder. Jerelene Redden, ANP (Adult Nurse Practitioner)  REASON FOR CONSULTATION: Dysphagia.   HISTORY OF PRESENT ILLNESS: This is a 59 year old patient of Dr. Rutherford Nail who came into the hospital 11/21/2013 for a 3-to-4 day history of intractable nausea, vomiting and diarrhea. He also had mental status changes. His wife reports that she thought he had become severely dehydrated from possibly a virus. There were no family ill contacts. She thinks he had a fever because he did some sweating.   He has had a long history of reflux for years and without previous studies. He has been swallowing fine until yesterday or the day before when the family noticed he began coughing when he was drinking liquids. He swallowed all solid food without coughing. He denied problems currently with sore throat but he may have had a sore throat prior to admission.   Admission CT study showed mild mucosal thickening of the ethmoid air cells. Orbital soft tissues unremarkable. There was no acute intracranial abnormality. Because of the new liquid dysphagia, he also underwent an MRI of the head without contrast performed yesterday and it showed mild to moderate paranasal sinus fluid, mucosal thickening, no acute intracranial abnormality.   The patient also a underwent modified barium swallow study by speech therapy. The patient was lethargic when he was doing the study. There was silent deep laryngeal penetration with laryngeal vestibule residue. The patient was at risk for aspiration with thin and nectar-thick liquids. He had no aspiration with honey-thick liquid or solids.   This patient has presented with alcohol abuse history and went through delirium tremens and a CIWA precaution during this admission. He presents now today sedated with  additional Benadryl for pruritus.   PAST MEDICAL HISTORY:  1.  Benign hypertension.  2.  Tobacco abuse.  3.  Alcohol abuse x40 years.   MEDICATIONS: Verapamil 240 mg daily.   ALLERGIES: NKDA.   PAST SURGICAL HISTORY: None listed.   SOCIAL HISTORY: He is married, smokes about a pack a day for at least 40 years. He quit for a 5-year period. Positive daily beer use for about 40 years. Drank light beer several beers a day, lifelong. His wife thinks he has had increased alcohol use since he has retired, June 2014.   FAMILY HISTORY: Father deceased recently with CVA at age 55. Mother is deceased. Negative for colon cancer, colon polyps, or GI malignancy.   REVIEW OF SYSTEMS: Unable to obtain as the patient is sleeping. His wife was at the bedside, provided above history.   PHYSICAL EXAMINATION:  VITAL SIGNS: 99.1, 82, 16, 120/72.  GENERAL: Well-nourished Caucasian male sitting up in bed sound asleep.  HEART: Heart tones are regular CTA.  ABDOMEN: Soft. Does not appear tender, although the patient is sedated.  EXTREMITIES: Lower extremities without edema, cyanosis, clubbing.  SKIN: Warm and dry.   LABORATORY DATA: Admission blood work notable for glucose 121, BUN 6. Sodium was 129, potassium 3.7, albumin 2.9, total bilirubin 3.2, AST 78. WBC 11.8. Hemoglobin 19.7, platelet count 131,   Follow-up laboratory studies to include today with BUN 3, creatinine 0.63, sodium 138, potassium 3.5. Magnesium initially 1.5, now 1.8. Ammonia 14. WBC 9.6, hemoglobin 16.5. Platelet count was 110 to 109, now 158.   MRI of the brain and modified barium swallow study as noted  in the history.   Chest x-ray, single view, performed 05/24/14 , showed no active disease.   IMPRESSION:  1.  The patient was admitted to the hospital with encephalopathy thought from alcohol withdrawal. There has been a suspicion of some early cognitive decline from chronic alcohol use and possible depression. His wife reports that he  has had a hard time of it recently with the loss of his dad and early retirement. He did start drinking more heavily.  2.  The patient has acute-onset liquid dysphagia. The modified barium swallow study was done with him being lethargic. I am not certain how much of the results are due to his mental status versus a structural problem. He has had no problems with dysphagia. His wife does report chronic intermittent long-term history of reflux for years with p.r.n. over-the-counter medication, uninvestigated.  3.  He has watery eyes. He has had recent prednisone, Benadryl. His head studies have suggested possible abnormality in the sinuses and I noticed he is on IV. He is receiving IV Rocephin.  4.  Abnormal liver studies. Will need to be repeated. Probably related to long-term alcohol use. Thrombocytopenia noted initially. Consider abdominal ultrasound to check liver to evaluate for common bile duct, cirrhosis. Will check serial liver panels, pro time, INR. Ammonia level noted to be normal. Would also check hepatitis A, B, C as part of the work-up for elevation and AST, and total bilirubin noted to be elevated at 3.2. This will need to be followed.   5.  Mild thrombocytopenia on admission with normal platelet count today to be followed. This can be related to his liver history. The patient's family is interested in outpatient counseling for alcohol. The patient has not been awake enough to have a discussion with a Education officer, museum regarding his alcohol rehab. He was up to beer a 12-pack a day with his recent retirement.   6  Luminal evaluation per Dr. Percell Boston recommendation.   I will discuss this case with Dr. Vira Agar in collaboration of care.   Thank you for the consult.   These services provided by Joelene Millin A. Jerelene Redden, MS, APRN, BC, ANP (Adult Nurse Practitioner) under collaborative agreement with Gaylyn Cheers, M.D.  ____________________________ Janalyn Harder. Jerelene Redden, ANP (Adult Nurse  Practitioner) kam:np D: 11/26/2013 17:11:45 ET T: 11/26/2013 17:47:07 ET JOB#: [id number]  cc: Joelene Millin A. Jerelene Redden, ANP (Adult Nurse Practitioner), <Dictator> Janalyn Harder Sherlyn Hay, MSN, ANP-BC Adult Nurse Practitioner ELECTRONICALLY SIGNED 11/29/2013 9:31

## 2015-02-04 NOTE — Consult Note (Signed)
Consult done by Dawson Bills NP.  discussed with her and I saw the patient and talked to the family.  Recommend repeat modified barium swallow early next week when he is not as lethargic as reported at the time of the previous test.  Should have EGD before discharge but not right now.  Will follow with you.  Electronic Signatures: Manya Silvas (MD)  (Signed on 13-Feb-15 19:43)  Authored  Last Updated: 13-Feb-15 19:43 by Manya Silvas (MD)

## 2015-02-04 NOTE — Consult Note (Signed)
Pt asleep, spoke with son who stayed with him during the night. Patient was agitated off and on.  Son reported coughing, congestion, confusion.  EXam shows VSS afebrile, chest clear ant fields, abd with active bowel sounds.  Recommend repeat MBSS when more awake and alert, when that will be is uncertain at this time.    Electronic Signatures: Manya Silvas (MD)  (Signed on 15-Feb-15 09:30)  Authored  Last Updated: 15-Feb-15 09:30 by Manya Silvas (MD)

## 2015-02-04 NOTE — Consult Note (Signed)
Brief Consult Note: Diagnosis: Alcohol dependence, Wernicke encephalopathy.   Patient was seen by consultant.   Consult note dictated.   Recommend further assessment or treatment.   Discussed with Attending MD.   Comments: Christian Griffith has a h/o drinking. He completed CIWA protocol but remains confused with terrible memory loss and confabulation.   This morning he was agitated and received 20 mg of im Geodon. I tried to see him twice today but the patient was asleep. No family in the room.Marland Kitchen   PLAN: 1. Please continue supportive measures as outlined by Dr. Manuella Ghazi.  2. Will folow along.  Electronic Signatures: Orson Slick (MD)  (Signed 16-Feb-15 20:38)  Authored: Brief Consult Note   Last Updated: 16-Feb-15 20:38 by Orson Slick (MD)

## 2015-02-04 NOTE — Discharge Summary (Signed)
PATIENT NAME:  Christian Griffith, Christian Griffith MR#:  [id number] DATE OF BIRTH:  13-Sep-1956  DATE OF ADMISSION:  11/21/2013 DATE OF DISCHARGE:  12/01/2013  PRIMARY CARE PHYSICIAN: Ashok Norris, MD  DISCHARGE DIAGNOSES: 1.  Alcohol abuse.  2.  Alcohol withdrawal syndrome.  3.  Wernicke's encephalopathy.  4.  Dysphagia.  5.  Acute bronchitis.  6.  Tobacco abuse.  7.  Dehydration.  8.  Hyponatremia.  9.  Hypokalemia.  10.  Hypomagnesemia.  11.  Mild thrombocytopenia.  12.  Hyperbilirubinemia, which has resolved.   IMAGING STUDIES: Include a CT scan of the head without contrast, which showed no acute abnormalities.   Chest x-ray, PA and lateral, showed no effusion, pneumonia or pneumothorax.   MRI of the brain without contrast showed some mild atrophy, paranasal sinus inflammatory changes, no stroke.   CONSULTS: Dr. Bary Leriche of psychiatry and Dr. Manuella Ghazi with neurology.   ADMITTING HISTORY AND PHYSICAL AND HOSPITAL COURSE: Please see detailed H and P dictated previously by Dr. Doy Hutching. In brief, a 59 year old male patient with history of alcohol abuse for over 3 decades who had been drinking more alcohol for the past 6 months since his retirement, presented to the hospital with nausea, vomiting, and confusion. The patient was admitted to the hospitalist service for alcohol withdrawal and confusion.   HOSPITAL COURSE: 1.  Alcohol abuse, withdrawal, and Wernicke's encephalopathy. The patient was on withdrawal  protocol. He did have dysarthria, dysphagia, ataxia, and nystagmus. The patient after his withdrawals had resolved had a consultation with Dr. Manuella Ghazi of neurology, who agreed that the patient had Wernicke's encephalopathy, suggested continuing thiamine. The patient was also seen by psychiatry, who did not feel there was a psychiatric illness which was contributing. The patient is out of his withdrawals, presently does have cognitive impairment and Wernicke's encephalopathy. His dysphagia has  significantly improved. He did initially fail a modified barium swallow study. Presently has done well, but will continue to be on aspiration precautions with regular diet. I have discussed the plan of care with his wife, who was initially concerned about taking the patient home, but later with 24-hour care at home has agreed to take the patient home. She did meet the social worker regarding options, did not want the patient to go to assisted living facility. I have asked that he follow up with neurology as outpatient and also discuss with primary care physician regarding primary care. I am extremely concerned that the patient would revert back to drinking beer, which he has done for so long, but have counseled him extensively to quit smoking and alcohol most days during his admission including the day of discharge.  2.  The patient did have mild acute bronchitis, which has been treated during the hospital stay, along with multiple electrolytes abnormalities.  3.  The patient was seen by GI for concern with dysphagia, which is improving. If this does not improve, he will need an endoscopy as outpatient.   Today on examination, the patient is alert, awake, oriented to person, place, time. Has ambulated. Lungs sound clear. Cardiac examination shows S1, S2, without any murmurs.   DISCHARGE MEDICATIONS: Include:  1.  Thiamine 100 mg oral once a day.  2.  Verapamil 240 mg oral once a day.  3.  Fish oil 1000 mg oral 2 times a day.  4.  Pravastatin 20 mg daily.  5.  Seroquel 50 mg oral once a day at bedtime.  6.  Aspirin 81 mg daily.  7.  Folic  acid 1 mg daily.  8.  Levaquin 500 mg oral once a day.  9.  Ativan 1 mg oral 2 times a day as needed for anxiety.   DISCHARGE INSTRUCTIONS: Include low-sodium diet. Activity as tolerated. The patient can be on regular diet, but with aspiration precautions to sit upright, to have small sips. Follow up with primary care physician in 1 to 2 weeks and Dr. Manuella Ghazi of  neurology in 2 to 4 weeks.   TIME SPENT: On day of discharge in discharge activity was 40 minutes.  ____________________________ Leia Alf Rainee Sweatt, MD srs:jcm D: 12/01/2013 14:13:31 ET T: 12/01/2013 15:01:23 ET JOB#: [id number]  cc: Alveta Heimlich R. Angelus Hoopes, MD, <Dictator> Ashok Norris, MD Hemang K. Manuella Ghazi, MD Neita Carp MD ELECTRONICALLY SIGNED 12/02/2013 14:18

## 2015-07-07 ENCOUNTER — Encounter: Payer: Self-pay | Admitting: Family Medicine

## 2015-07-07 DIAGNOSIS — Z87891 Personal history of nicotine dependence: Secondary | ICD-10-CM | POA: Insufficient documentation

## 2015-07-07 DIAGNOSIS — F1021 Alcohol dependence, in remission: Secondary | ICD-10-CM | POA: Insufficient documentation

## 2015-07-07 DIAGNOSIS — E871 Hypo-osmolality and hyponatremia: Secondary | ICD-10-CM | POA: Insufficient documentation

## 2015-07-07 DIAGNOSIS — E785 Hyperlipidemia, unspecified: Secondary | ICD-10-CM | POA: Insufficient documentation

## 2015-07-07 DIAGNOSIS — E539 Vitamin B deficiency, unspecified: Secondary | ICD-10-CM | POA: Insufficient documentation

## 2015-07-07 DIAGNOSIS — E559 Vitamin D deficiency, unspecified: Secondary | ICD-10-CM | POA: Insufficient documentation

## 2015-07-07 DIAGNOSIS — Z720 Tobacco use: Secondary | ICD-10-CM

## 2015-07-10 ENCOUNTER — Encounter: Payer: Self-pay | Admitting: Family Medicine

## 2015-07-10 ENCOUNTER — Ambulatory Visit (INDEPENDENT_AMBULATORY_CARE_PROVIDER_SITE_OTHER): Payer: BLUE CROSS/BLUE SHIELD | Admitting: Family Medicine

## 2015-07-10 VITALS — BP 132/62 | HR 67 | Temp 98.0°F | Resp 18 | Ht 67.0 in | Wt 158.1 lb

## 2015-07-10 DIAGNOSIS — F1096 Alcohol use, unspecified with alcohol-induced persisting amnestic disorder: Secondary | ICD-10-CM | POA: Diagnosis not present

## 2015-07-10 DIAGNOSIS — Z720 Tobacco use: Secondary | ICD-10-CM

## 2015-07-10 DIAGNOSIS — I10 Essential (primary) hypertension: Secondary | ICD-10-CM

## 2015-07-10 DIAGNOSIS — E559 Vitamin D deficiency, unspecified: Secondary | ICD-10-CM

## 2015-07-10 DIAGNOSIS — Z0000 Encounter for general adult medical examination without abnormal findings: Secondary | ICD-10-CM | POA: Diagnosis not present

## 2015-07-10 DIAGNOSIS — E785 Hyperlipidemia, unspecified: Secondary | ICD-10-CM

## 2015-07-10 DIAGNOSIS — J449 Chronic obstructive pulmonary disease, unspecified: Secondary | ICD-10-CM | POA: Diagnosis not present

## 2015-07-10 DIAGNOSIS — Z125 Encounter for screening for malignant neoplasm of prostate: Secondary | ICD-10-CM | POA: Diagnosis not present

## 2015-07-10 DIAGNOSIS — Z1211 Encounter for screening for malignant neoplasm of colon: Secondary | ICD-10-CM

## 2015-07-10 DIAGNOSIS — E539 Vitamin B deficiency, unspecified: Secondary | ICD-10-CM

## 2015-07-10 DIAGNOSIS — B356 Tinea cruris: Secondary | ICD-10-CM | POA: Diagnosis not present

## 2015-07-10 DIAGNOSIS — E871 Hypo-osmolality and hyponatremia: Secondary | ICD-10-CM | POA: Diagnosis not present

## 2015-07-10 DIAGNOSIS — Z23 Encounter for immunization: Secondary | ICD-10-CM | POA: Diagnosis not present

## 2015-07-10 MED ORDER — VERAPAMIL HCL ER 180 MG PO CP24: 180.0000 mg | ORAL_CAPSULE | Freq: Every day | ORAL | Status: DC

## 2015-07-10 MED ORDER — KETOCONAZOLE 2 % EX CREA: 1.0000 "application " | TOPICAL_CREAM | Freq: Every day | CUTANEOUS | Status: DC

## 2015-07-10 MED ORDER — FLUCONAZOLE 150 MG PO TABS: 150.0000 mg | ORAL_TABLET | ORAL | Status: DC

## 2015-07-10 NOTE — Progress Notes (Signed)
Name: Christian Griffith   MRN: [id number]    DOB: 26-Jun-1956   Date:07/10/2015       Progress Note  Subjective  Chief Complaint  Chief Complaint  Patient presents with  . Annual Exam    HPI  Male Exam: he has been feeling well, still not drinking alcohol, but is still smoking. Denies any family history of prostate cancer.   HTN: he takes medications daily and denies side effects of medication. No chest pain or palpitation.   Korsakoff Syndrome: used to see Dr Manuella Ghazi, currently states no longer has confusion, he still takes Thiamine, B12 and vitamin D daily.  No balance problems, no dizziness, no tremors. He is back drinking one beer occasionally with friends or at weddings. Advised to stop completely to avoid problems.   Colon cancer screen: refused colonoscopy but willing to have Cologuard done, discussed importance to check coverage with insurance  COPD: he denies morning cough, no wheezing or SOB, he used to be a heavy smoker but is down to 10 cigarettes daily .     Patient Active Problem List   Diagnosis Date Noted  . Dyslipidemia 07/07/2015  . H/O alcohol abuse 07/07/2015  . Hyponatremia 07/07/2015  . Tobacco abuse 07/07/2015  . Deficiency of vitamin B 07/07/2015  . Vitamin D deficiency 07/07/2015  . Alcohol induced Korsakoff syndrome 07/07/2015  . Hay fever 04/12/2009  . Benign essential HTN 04/12/2009  . COPD, mild 04/12/2009    Past Surgical History  Procedure Laterality Date  . Vasectomy      Family History  Problem Relation Age of Onset  . Dementia Mother   . Hypertension Father     Social History   Social History  . Marital Status: Married    Spouse Name: N/A  . Number of Children: N/A  . Years of Education: N/A   Occupational History  . Not on file.   Social History Main Topics  . Smoking status: Current Every Day Smoker -- 0.50 packs/day for 40 years    Types: Cigarettes    Start date: 07/10/1975  . Smokeless tobacco: Never Used  . Alcohol  Use: No  . Drug Use: No  . Sexual Activity:    Partners: Female   Other Topics Concern  . Not on file   Social History Narrative     Current outpatient prescriptions:  .  cholecalciferol (VITAMIN D) 1000 UNITS tablet, Take by mouth., Disp: , Rfl:  .  MULTIPLE VITAMINS PO, Take by mouth., Disp: , Rfl:  .  Thiamine Mononitrate (VITAMIN B1) 100 MG TABS, Take by mouth., Disp: , Rfl:  .  verapamil (VERELAN PM) 180 MG 24 hr capsule, Take 1 capsule (180 mg total) by mouth daily., Disp: 90 capsule, Rfl: 1 .  fluconazole (DIFLUCAN) 150 MG tablet, Take 1 tablet (150 mg total) by mouth every other day., Disp: 3 tablet, Rfl: 0 .  ketoconazole (NIZORAL) 2 % cream, Apply 1 application topically daily., Disp: 60 g, Rfl: 0  No Known Allergies   ROS  Constitutional: Negative for fever, positive for mild  weight change.  Respiratory: Negative for cough and shortness of breath.   Cardiovascular: Negative for chest pain or palpitations.  Gastrointestinal: Negative for abdominal pain, no bowel changes.  Musculoskeletal: Negative for gait problem or joint swelling.  Skin: Negative for rash.  Neurological: Negative for dizziness or headache.  No other specific complaints in a complete review of systems (except as listed in HPI above).  Objective  Filed  Vitals:   07/10/15 0836  BP: 132/62  Pulse: 67  Temp: 98 F (36.7 C)  TempSrc: Oral  Resp: 18  Height: 5\' 7"  (1.702 m)  Weight: 158 lb 1.6 oz (71.714 kg)  SpO2: 97%    Body mass index is 24.76 kg/(m^2).  Physical Exam  Constitutional: Patient appears well-developed and well-nourished. No distress.  HENT: Head: Normocephalic and atraumatic. Ears: B TMs ok, no erythema or effusion; Nose: Nose normal. Mouth/Throat: Oropharynx is clear and moist. No oropharyngeal exudate.  Eyes: Conjunctivae and EOM are normal. Pupils are equal, round, and reactive to light. No scleral icterus.  Neck: Normal range of motion. Neck supple. No JVD present. No  thyromegaly present.  Cardiovascular: Normal rate, regular rhythm and normal heart sounds.  No murmur heard. No BLE edema. Pulmonary/Chest: Effort normal and breath sounds normal. No respiratory distress. Abdominal: Soft. Bowel sounds are normal, no distension. There is no tenderness. no masses MALE GENITALIA: Normal descended testes bilaterally, no masses palpated, no hernias, no lesions, no discharge RECTAL: Prostate normal size and consistency, no rectal masses or hemorrhoids Musculoskeletal: Normal range of motion, no joint effusions. No gross deformities. Wearing a soft brace of left knee Neurological: he is alert and oriented to person, place, and time. No cranial nerve deficit. Coordination, balance, strength, speech and gait are normal.  Skin: Skin is warm and dry. He has an erythematous rash with some excoriation on buttocks and inguinal area Psychiatric: Patient has a normal mood and affect. behavior is normal. Judgment and thought content normal.   PHQ2/9: Depression screen PHQ 2/9 07/10/2015  Decreased Interest 0  Down, Depressed, Hopeless 0  PHQ - 2 Score 0     Fall Risk: Fall Risk  07/10/2015  Falls in the past year? No     Functional Status Survey: Is the patient deaf or have difficulty hearing?: No Does the patient have difficulty seeing, even when wearing glasses/contacts?: Yes (glasses) Does the patient have difficulty concentrating, remembering, or making decisions?: No Does the patient have difficulty walking or climbing stairs?: No Does the patient have difficulty dressing or bathing?: No Does the patient have difficulty doing errands alone such as visiting a doctor's office or shopping?: No    Assessment & Plan  1. Encounter for routine history and physical exam for male  Discussed importance of 150 minutes of physical activity weekly, eat two servings of fish weekly, eat one serving of tree nuts ( cashews, pistachios, pecans, almonds.Marland Kitchen) every other day,  eat 6 servings of fruit/vegetables daily and drink plenty of water and avoid sweet beverages.   2. Needs flu shot  - Flu Vaccine QUAD 36+ mos PF IM (Fluarix & Fluzone Quad PF) - refused  3. Benign essential HTN  - verapamil (VERELAN PM) 180 MG 24 hr capsule; Take 1 capsule (180 mg total) by mouth daily.  Dispense: 90 capsule; Refill: 1 - CBC with Differential/Platelet - Comprehensive metabolic panel  4. COPD, mild  Advised him to quit  5. Dyslipidemia  - Lipid panel  6. Tobacco abuse  Not ready to quit smoking  7. Deficiency of vitamin B  - Vitamin B12 - Vitamin B1  8. Vitamin D deficiency  - Vit D  25 hydroxy (rtn osteoporosis monitoring)  9. Hyponatremia  Recheck sodium   10. Alcohol induced Korsakoff syndrome  Advised to stay off alcohol, back drinking occasionally.   11. Need for shingles vaccine  - Varicella-zoster vaccine subcutaneous  12. Colon cancer screening  - Cologuard  13.  Prostate cancer screening  Discussed current USPTF guidelines during CPE  14. Tinea cruris  - fluconazole (DIFLUCAN) 150 MG tablet; Take 1 tablet (150 mg total) by mouth every other day.  Dispense: 3 tablet; Refill: 0 - ketoconazole (NIZORAL) 2 % cream; Apply 1 application topically daily.  Dispense: 60 g; Refill: 0

## 2015-07-11 ENCOUNTER — Other Ambulatory Visit: Payer: Self-pay | Admitting: Family Medicine

## 2015-07-11 DIAGNOSIS — E785 Hyperlipidemia, unspecified: Secondary | ICD-10-CM

## 2015-07-11 LAB — CBC WITH DIFFERENTIAL/PLATELET
BASOS: 1 %
Basophils Absolute: 0.1 10*3/uL (ref 0.0–0.2)
EOS (ABSOLUTE): 0.4 10*3/uL (ref 0.0–0.4)
EOS: 4 %
HEMATOCRIT: 49.9 % (ref 37.5–51.0)
HEMOGLOBIN: 17.6 g/dL (ref 12.6–17.7)
IMMATURE GRANS (ABS): 0 10*3/uL (ref 0.0–0.1)
Immature Granulocytes: 0 %
LYMPHS: 30 %
Lymphocytes Absolute: 3 10*3/uL (ref 0.7–3.1)
MCH: 30.2 pg (ref 26.6–33.0)
MCHC: 35.3 g/dL (ref 31.5–35.7)
MCV: 86 fL (ref 79–97)
MONOCYTES: 8 %
Monocytes Absolute: 0.8 10*3/uL (ref 0.1–0.9)
NEUTROS ABS: 5.9 10*3/uL (ref 1.4–7.0)
Neutrophils: 57 %
Platelets: 202 10*3/uL (ref 150–379)
RBC: 5.83 x10E6/uL — ABNORMAL HIGH (ref 4.14–5.80)
RDW: 14 % (ref 12.3–15.4)
WBC: 10.2 10*3/uL (ref 3.4–10.8)

## 2015-07-11 LAB — COMPREHENSIVE METABOLIC PANEL
A/G RATIO: 1.8 (ref 1.1–2.5)
ALBUMIN: 4.6 g/dL (ref 3.6–4.8)
ALT: 12 IU/L (ref 0–44)
AST: 14 IU/L (ref 0–40)
Alkaline Phosphatase: 83 IU/L (ref 39–117)
BILIRUBIN TOTAL: 0.5 mg/dL (ref 0.0–1.2)
BUN / CREAT RATIO: 7 — AB (ref 10–22)
BUN: 6 mg/dL — ABNORMAL LOW (ref 8–27)
CALCIUM: 9.9 mg/dL (ref 8.6–10.2)
CHLORIDE: 99 mmol/L (ref 97–108)
CO2: 24 mmol/L (ref 18–29)
Creatinine, Ser: 0.84 mg/dL (ref 0.76–1.27)
GFR, EST AFRICAN AMERICAN: 109 mL/min/{1.73_m2} (ref >59)
GFR, EST NON AFRICAN AMERICAN: 95 mL/min/{1.73_m2} (ref >59)
Globulin, Total: 2.6 g/dL (ref 1.5–4.5)
Glucose: 93 mg/dL (ref 65–99)
Potassium: 4.9 mmol/L (ref 3.5–5.2)
Sodium: 141 mmol/L (ref 134–144)
TOTAL PROTEIN: 7.2 g/dL (ref 6.0–8.5)

## 2015-07-11 LAB — LIPID PANEL
CHOL/HDL RATIO: 5 ratio (ref 0.0–5.0)
Cholesterol, Total: 206 mg/dL — ABNORMAL HIGH (ref 100–199)
HDL: 41 mg/dL (ref >39)
LDL Calculated: 137 mg/dL — ABNORMAL HIGH (ref 0–99)
Triglycerides: 140 mg/dL (ref 0–149)
VLDL CHOLESTEROL CAL: 28 mg/dL (ref 5–40)

## 2015-07-11 LAB — VITAMIN B12: VITAMIN B 12: 933 pg/mL (ref 211–946)

## 2015-07-11 LAB — VITAMIN D 25 HYDROXY (VIT D DEFICIENCY, FRACTURES): Vit D, 25-Hydroxy: 31.9 ng/mL (ref 30.0–100.0)

## 2015-07-11 MED ORDER — ATORVASTATIN CALCIUM 40 MG PO TABS: 40.0000 mg | ORAL_TABLET | Freq: Every day | ORAL | Status: DC

## 2015-07-12 LAB — VITAMIN B1: THIAMINE: 280.9 nmol/L — AB (ref 66.5–200.0)

## 2015-07-12 NOTE — Progress Notes (Signed)
Patient notified but states may not take chol. Med, may try diet?

## 2015-07-13 NOTE — Progress Notes (Signed)
Patient notified by phone.

## 2015-07-20 ENCOUNTER — Telehealth: Payer: Self-pay | Admitting: Family Medicine

## 2015-07-20 NOTE — Telephone Encounter (Signed)
Gave them the procedure code [phone] to call their Insurance for coverage details.

## 2015-07-20 NOTE — Telephone Encounter (Signed)
Pts wife called with questions about codes for Cologuard. Please return her call.

## 2016-01-08 ENCOUNTER — Encounter: Payer: Self-pay | Admitting: Family Medicine

## 2016-01-08 ENCOUNTER — Ambulatory Visit (INDEPENDENT_AMBULATORY_CARE_PROVIDER_SITE_OTHER): Payer: BLUE CROSS/BLUE SHIELD | Admitting: Family Medicine

## 2016-01-08 VITALS — BP 128/60 | HR 65 | Temp 98.7°F | Resp 14 | Wt 161.0 lb

## 2016-01-08 DIAGNOSIS — E559 Vitamin D deficiency, unspecified: Secondary | ICD-10-CM | POA: Diagnosis not present

## 2016-01-08 DIAGNOSIS — F1021 Alcohol dependence, in remission: Secondary | ICD-10-CM | POA: Diagnosis not present

## 2016-01-08 DIAGNOSIS — I10 Essential (primary) hypertension: Secondary | ICD-10-CM | POA: Diagnosis not present

## 2016-01-08 DIAGNOSIS — Z720 Tobacco use: Secondary | ICD-10-CM

## 2016-01-08 DIAGNOSIS — J449 Chronic obstructive pulmonary disease, unspecified: Secondary | ICD-10-CM | POA: Diagnosis not present

## 2016-01-08 DIAGNOSIS — E785 Hyperlipidemia, unspecified: Secondary | ICD-10-CM

## 2016-01-08 MED ORDER — VERAPAMIL HCL ER 180 MG PO CP24: 180.0000 mg | ORAL_CAPSULE | Freq: Every day | ORAL | Status: DC

## 2016-01-08 NOTE — Progress Notes (Signed)
Name: Christian Griffith   MRN: [id number]    DOB: [birth year]   Date:01/08/2016       Progress Note  Subjective  Chief Complaint  Chief Complaint  Patient presents with  . Medication Refill    6 month F/U  . Hypertension  . Hyperlipidemia    HPI  HTN: he takes medications daily and denies side effects of medication. No chest pain or palpitation. He denies dizziness.   Korsakoff Syndrome: used to see Dr Manuella Ghazi, currently states no longer has confusion, he still takes Thiamine, B12 and vitamin D daily. No balance problems, no dizziness, no tremors. He is no longer drinking. The last time he drank was on rehearsal dinner for his son in September 2016  Colon cancer screen: refused colonoscopy and Cologuard not covered by insurance.   COPD: he denies morning cough, no wheezing or SOB, he used to be a heavy smoker but is down to 10 cigarettes daily. He walks a golf course.   Dyslipidemia: he is on diet only, never took statin therapy .    Patient Active Problem List   Diagnosis Date Noted  . Dyslipidemia 07/07/2015  . H/O alcohol abuse 07/07/2015  . Hyponatremia 07/07/2015  . Tobacco abuse 07/07/2015  . Deficiency of vitamin B 07/07/2015  . Vitamin D deficiency 07/07/2015  . Alcohol induced Korsakoff syndrome ([place]) 07/07/2015  . Hay fever 04/12/2009  . Benign essential HTN 04/12/2009  . COPD, mild ([place]) 04/12/2009    Past Surgical History  Procedure Laterality Date  . Vasectomy      Family History  Problem Relation Age of Onset  . Dementia Mother   . Hypertension Father     Social History   Social History  . Marital Status: Married    Spouse Name: N/A  . Number of Children: N/A  . Years of Education: N/A   Occupational History  . Not on file.   Social History Main Topics  . Smoking status: Current Every Day Smoker -- 0.50 packs/day for 40 years    Types: Cigarettes    Start date: 07/10/1975  . Smokeless tobacco: Never Used  . Alcohol Use: No  . Drug Use: No   . Sexual Activity:    Partners: Female   Other Topics Concern  . Not on file   Social History Narrative     Current outpatient prescriptions:  .  aspirin 81 MG tablet, Take 81 mg by mouth daily., Disp: , Rfl:  .  cholecalciferol (VITAMIN D) 1000 UNITS tablet, Take by mouth., Disp: , Rfl:  .  Krill Oil 300 MG CAPS, Take by mouth., Disp: , Rfl:  .  Thiamine Mononitrate (VITAMIN B1) 100 MG TABS, Take by mouth 3 (three) times a week. , Disp: , Rfl:  .  verapamil (VERELAN PM) 180 MG 24 hr capsule, Take 1 capsule (180 mg total) by mouth daily., Disp: 90 capsule, Rfl: 1 .  vitamin B-12 (CYANOCOBALAMIN) 1000 MCG tablet, Take 1,000 mcg by mouth 3 (three) times a week., Disp: , Rfl:   No Known Allergies   ROS  Constitutional: Negative for fever or weight change.  Respiratory: Negative for cough and shortness of breath.   Cardiovascular: Negative for chest pain or palpitations.  Gastrointestinal: Negative for abdominal pain, no bowel changes.  Musculoskeletal: Negative for gait problem or joint swelling.  Skin: Negative for rash.  Neurological: Negative for dizziness or headache.  No other specific complaints in a complete review of systems (except as listed in HPI  above).  Objective  Filed Vitals:   01/08/16 0859  BP: 128/60  Pulse: 65  Temp: 98.7 F (37.1 C)  TempSrc: Oral  Resp: 14  Weight: 161 lb (73.029 kg)  SpO2: 96%    Body mass index is 25.21 kg/(m^2).  Physical Exam  Constitutional: Patient appears well-developed and well-nourished. No distress.  HEENT: head atraumatic, normocephalic, pupils equal and reactive to light,  neck supple, throat within normal limits Cardiovascular: Normal rate, regular rhythm and normal heart sounds.  No murmur heard. No BLE edema. Pulmonary/Chest: Effort normal and breath sounds normal. No respiratory distress. Abdominal: Soft.  There is no tenderness. Psychiatric: Patient has a normal mood and affect. behavior is normal. Judgment  and thought content normal.  PHQ2/9: Depression screen [facility] 2/9 01/08/2016 07/10/2015  Decreased Interest 0 0  Down, Depressed, Hopeless 0 0  PHQ - 2 Score 0 0    Fall Risk: Fall Risk  01/08/2016 07/10/2015  Falls in the past year? No No    Functional Status Survey: Is the patient deaf or have difficulty hearing?: No Does the patient have difficulty seeing, even when wearing glasses/contacts?: No Does the patient have difficulty concentrating, remembering, or making decisions?: No Does the patient have difficulty walking or climbing stairs?: No Does the patient have difficulty dressing or bathing?: No Does the patient have difficulty doing errands alone such as visiting a doctor's office or shopping?: No    Assessment & Plan  1. Benign essential HTN  - verapamil (VERELAN PM) 180 MG 24 hr capsule; Take 1 capsule (180 mg total) by mouth daily.  Dispense: 90 capsule; Refill: 1  2. COPD, mild ([place])  Doing well, discussed importance of quitting smoking  3. Dyslipidemia  Discussed life style modification  4. Vitamin D deficiency  Taking otc supplementation   5. Tobacco abuse  He does not want to take medication to quit smoking at this time  6. History of alcoholism ([place])  No longer having Korsakoff symptoms

## 2016-07-16 ENCOUNTER — Ambulatory Visit (INDEPENDENT_AMBULATORY_CARE_PROVIDER_SITE_OTHER): Payer: BLUE CROSS/BLUE SHIELD | Admitting: Family Medicine

## 2016-07-16 ENCOUNTER — Encounter: Payer: Self-pay | Admitting: Family Medicine

## 2016-07-16 VITALS — BP 118/66 | HR 62 | Temp 98.1°F | Wt 160.5 lb

## 2016-07-16 DIAGNOSIS — Z720 Tobacco use: Secondary | ICD-10-CM | POA: Diagnosis not present

## 2016-07-16 DIAGNOSIS — I10 Essential (primary) hypertension: Secondary | ICD-10-CM

## 2016-07-16 DIAGNOSIS — Z0000 Encounter for general adult medical examination without abnormal findings: Secondary | ICD-10-CM | POA: Diagnosis not present

## 2016-07-16 DIAGNOSIS — R21 Rash and other nonspecific skin eruption: Secondary | ICD-10-CM

## 2016-07-16 DIAGNOSIS — E539 Vitamin B deficiency, unspecified: Secondary | ICD-10-CM | POA: Diagnosis not present

## 2016-07-16 DIAGNOSIS — E785 Hyperlipidemia, unspecified: Secondary | ICD-10-CM | POA: Diagnosis not present

## 2016-07-16 DIAGNOSIS — J449 Chronic obstructive pulmonary disease, unspecified: Secondary | ICD-10-CM

## 2016-07-16 DIAGNOSIS — F1021 Alcohol dependence, in remission: Secondary | ICD-10-CM

## 2016-07-16 DIAGNOSIS — E559 Vitamin D deficiency, unspecified: Secondary | ICD-10-CM

## 2016-07-16 DIAGNOSIS — Z23 Encounter for immunization: Secondary | ICD-10-CM | POA: Diagnosis not present

## 2016-07-16 DIAGNOSIS — Z125 Encounter for screening for malignant neoplasm of prostate: Secondary | ICD-10-CM

## 2016-07-16 LAB — PSA: PSA: 1.1 ng/mL (ref <=4.0)

## 2016-07-16 LAB — VITAMIN B12: Vitamin B-12: 426 pg/mL (ref 200–1100)

## 2016-07-16 MED ORDER — VERAPAMIL HCL ER 180 MG PO CP24: 180.0000 mg | ORAL_CAPSULE | Freq: Every day | ORAL | 1 refills | Status: DC

## 2016-07-16 NOTE — Progress Notes (Signed)
Name: Christian Griffith   MRN: [id number]    DOB: July 20, 1956   Date:07/16/2016       Progress Note  Subjective  Chief Complaint  Chief Complaint  Patient presents with  . Annual Exam    HPI  Male exam: no symptoms of ED, no chest pain, palpitation or decrease in exercise tolerance. Memory is back to baseline, no longer has tremors, does not drink but still smoking about 1 pack daily and advised to quit. He continues to refuse any form of colon cancer screening, and also refuses flu shot  HTN: taking medication, no side effects, denies constipation or swelling, no palpitation.  Hyperlipidemia: not on medication, on Krill oil only, he refused prescription  Anxiety: wife found an old rx of Lorazepam and was given to him prior to driving because he has been feeling anxious when around traffic. Advised to practice mindfulness, and avoid BZC prior to driving.   COPD: symptoms are mild, he still smokes one pack daily, he denies cough, wheezing or SOB   IPSS Questionnaire (AUA-7): Over the past month.   1)  How often have you had a sensation of not emptying your bladder completely after you finish urinating?  0 - Not at all  2)  How often have you had to urinate again less than two hours after you finished urinating? 1 - Less than 1 time in 5  3)  How often have you found you stopped and started again several times when you urinated?  0 - Not at all  4) How difficult have you found it to postpone urination?  0 - Not at all  5) How often have you had a weak urinary stream?  0 - Not at all  6) How often have you had to push or strain to begin urination?  0 - Not at all  7) How many times did you most typically get up to urinate from the time you went to bed until the time you got up in the morning?  0 - None  Total score:  0-7 mildly symptomatic   8-19 moderately symptomatic   20-35 severely symptomatic    Patient Active Problem List   Diagnosis Date Noted  . Dyslipidemia 07/07/2015  .  H/O alcohol abuse 07/07/2015  . Hyponatremia 07/07/2015  . Tobacco abuse 07/07/2015  . Deficiency of vitamin B 07/07/2015  . Vitamin D deficiency 07/07/2015  . History of alcoholism ([place]) 07/07/2015  . Hay fever 04/12/2009  . Benign essential HTN 04/12/2009  . COPD, mild ([place]) 04/12/2009    Past Surgical History:  Procedure Laterality Date  . VASECTOMY      Family History  Problem Relation Age of Onset  . Dementia Mother   . Hypertension Father     Social History   Social History  . Marital status: Married    Spouse name: N/A  . Number of children: N/A  . Years of education: N/A   Occupational History  . Not on file.   Social History Main Topics  . Smoking status: Current Every Day Smoker    Packs/day: 0.50    Years: 40.00    Types: Cigarettes    Start date: 07/10/1975  . Smokeless tobacco: Never Used  . Alcohol use No  . Drug use: No  . Sexual activity: Yes    Partners: Female   Other Topics Concern  . Not on file   Social History Narrative  . No narrative on file  Current Outpatient Prescriptions:  .  aspirin 81 MG tablet, Take 81 mg by mouth daily., Disp: , Rfl:  .  cholecalciferol (VITAMIN D) 1000 UNITS tablet, Take by mouth., Disp: , Rfl:  .  Krill Oil 300 MG CAPS, Take by mouth., Disp: , Rfl:  .  Thiamine Mononitrate (VITAMIN B1) 100 MG TABS, Take by mouth 3 (three) times a week. , Disp: , Rfl:  .  verapamil (VERELAN PM) 180 MG 24 hr capsule, Take 1 capsule (180 mg total) by mouth daily., Disp: 90 capsule, Rfl: 1 .  vitamin B-12 (CYANOCOBALAMIN) 1000 MCG tablet, Take 1,000 mcg by mouth 3 (three) times a week., Disp: , Rfl:   No Known Allergies   ROS  Constitutional: Negative for fever or weight change.  Respiratory: Negative for cough and shortness of breath.   Cardiovascular: Negative for chest pain or palpitations.  Gastrointestinal: Negative for abdominal pain, no bowel changes.  Musculoskeletal: Negative for gait problem or joint  swelling.  Skin: Positive for rash.  Neurological: Negative for dizziness or headache.  No other specific complaints in a complete review of systems (except as listed in HPI above).  Objective  Vitals:   07/16/16 0841  BP: 118/66  Pulse: 62  Temp: 98.1 F (36.7 C)  SpO2: 97%  Weight: 160 lb 8 oz (72.8 kg)    Body mass index is 25.14 kg/m.  Physical Exam  Constitutional: Patient appears well-developed and well-nourished. No distress.  HENT: Head: Normocephalic and atraumatic. Ears: B TMs ok, no erythema or effusion; Nose: Nose normal. Mouth/Throat: Oropharynx is clear and moist. No oropharyngeal exudate.  Eyes: Conjunctivae and EOM are normal. Pupils are equal, round, and reactive to light. No scleral icterus.  Neck: Normal range of motion. Neck supple. No JVD present. No thyromegaly present.  Cardiovascular: Normal rate, regular rhythm and normal heart sounds.  No murmur heard. No BLE edema. Pulmonary/Chest: Effort normal and breath sounds normal. No respiratory distress. Abdominal: Soft. Bowel sounds are normal, no distension. There is no tenderness. no masses MALE GENITALIA: Normal descended testes bilaterally, no masses palpated, no hernias, no lesions, no discharge RECTAL: Prostate normal size and consistency, no rectal masses or hemorrhoids Musculoskeletal: Normal range of motion, no joint effusions. No gross deformities Neurological: he is alert and oriented to person, place, and time. No cranial nerve deficit. Coordination, balance, strength, speech and gait are normal.  Skin: Skin is warm and dry. He has a round rash on mid back, discussed risk of skin cancer and importance of biopsy, he also has a rash, dry, well demarcated on buttocks and outer hips. He does not want to see dermatologist and states he has a cream at home. Psychiatric: Patient has a normal mood and affect. behavior is normal. Judgment and thought content normal.  PHQ2/9: Depression screen [facility] 2/9  07/16/2016 01/08/2016 07/10/2015  Decreased Interest 0 0 0  Down, Depressed, Hopeless 0 0 0  PHQ - 2 Score 0 0 0     Fall Risk: Fall Risk  07/16/2016 01/08/2016 07/10/2015  Falls in the past year? Yes No No  Number falls in past yr: 1 - -  Injury with Fall? Yes - -     Functional Status Survey: Is the patient deaf or have difficulty hearing?: No Does the patient have difficulty seeing, even when wearing glasses/contacts?: Yes (glasses) Does the patient have difficulty concentrating, remembering, or making decisions?: No Does the patient have difficulty walking or climbing stairs?: No Does the patient have difficulty dressing  or bathing?: No Does the patient have difficulty doing errands alone such as visiting a doctor's office or shopping?: No    Assessment & Plan  1. Well adult exam  Discussed importance of 150 minutes of physical activity weekly, eat two servings of fish weekly, eat one serving of tree nuts ( cashews, pistachios, pecans, almonds.Marland Kitchen) every other day, eat 6 servings of fruit/vegetables daily and drink plenty of water and avoid sweet beverages.  - CBC with Differential/Platelet  2. Benign essential HTN  - verapamil (VERELAN PM) 180 MG 24 hr capsule; Take 1 capsule (180 mg total) by mouth daily.  Dispense: 90 capsule; Refill: 1 - COMPLETE METABOLIC PANEL WITH GFR  3. COPD, mild ([facility])  Doing well , needs to quit smoking  4. History of alcoholism ([place])  In remission   5. Vitamin D deficiency  - VITAMIN D 25 Hydroxy (Vit-D Deficiency, Fractures)  6. Deficiency of vitamin B  - Vitamin B12 - Vitamin B1  7. Dyslipidemia  - Lipid panel  8. Tobacco abuse  Discussed importance of quitting smoking - CBC with Differential/Platelet  9. Prostate cancer screening  - PSA Discussed USPTF and he chose to have PSA done  10. Needs flu shot  refused  11. Rash  He refuses referral or therapy for it

## 2016-07-17 LAB — CBC WITH DIFFERENTIAL/PLATELET
Basophils Absolute: 103 cells/uL (ref 0–200)
Basophils Relative: 1 %
Eosinophils Absolute: 309 cells/uL (ref 15–500)
Eosinophils Relative: 3 %
HEMATOCRIT: 48.3 % (ref 38.5–50.0)
Hemoglobin: 16.3 g/dL (ref 13.2–17.1)
LYMPHS PCT: 24 %
Lymphs Abs: 2472 cells/uL (ref 850–3900)
MCH: 29.8 pg (ref 27.0–33.0)
MCHC: 33.7 g/dL (ref 32.0–36.0)
MCV: 88.3 fL (ref 80.0–100.0)
MONO ABS: 721 {cells}/uL (ref 200–950)
MONOS PCT: 7 %
MPV: 11.9 fL (ref 7.5–12.5)
NEUTROS PCT: 65 %
Neutro Abs: 6695 cells/uL (ref 1500–7800)
PLATELETS: 197 10*3/uL (ref 140–400)
RBC: 5.47 MIL/uL (ref 4.20–5.80)
RDW: 14.4 % (ref 11.0–15.0)
WBC: 10.3 10*3/uL (ref 3.8–10.8)

## 2016-07-17 LAB — LIPID PANEL
CHOLESTEROL: 188 mg/dL (ref 125–200)
HDL: 40 mg/dL (ref >=40)
LDL Cholesterol: 129 mg/dL (ref <130)
TRIGLYCERIDES: 95 mg/dL (ref <150)
Total CHOL/HDL Ratio: 4.7 Ratio (ref <=5.0)
VLDL: 19 mg/dL (ref <30)

## 2016-07-17 LAB — COMPLETE METABOLIC PANEL WITH GFR
ALBUMIN: 4.3 g/dL (ref 3.6–5.1)
ALT: 9 U/L (ref 9–46)
AST: 12 U/L (ref 10–35)
Alkaline Phosphatase: 73 U/L (ref 40–115)
BILIRUBIN TOTAL: 0.6 mg/dL (ref 0.2–1.2)
BUN: 7 mg/dL (ref 7–25)
CALCIUM: 9.5 mg/dL (ref 8.6–10.3)
CHLORIDE: 106 mmol/L (ref 98–110)
CO2: 28 mmol/L (ref 20–31)
CREATININE: 0.84 mg/dL (ref 0.70–1.25)
GFR, Est Non African American: >89 mL/min (ref >=60)
Glucose, Bld: 97 mg/dL (ref 65–99)
Potassium: 5 mmol/L (ref 3.5–5.3)
Sodium: 141 mmol/L (ref 135–146)
TOTAL PROTEIN: 6.6 g/dL (ref 6.1–8.1)

## 2016-07-17 LAB — VITAMIN D 25 HYDROXY (VIT D DEFICIENCY, FRACTURES): VIT D 25 HYDROXY: 33 ng/mL (ref 30–100)

## 2016-07-22 LAB — VITAMIN B1: VITAMIN B1 (THIAMINE): 26 nmol/L (ref 8–30)

## 2016-10-22 ENCOUNTER — Other Ambulatory Visit: Payer: Self-pay | Admitting: Family Medicine

## 2016-10-22 NOTE — Telephone Encounter (Signed)
Patient requesting refill of verapamil to [facility].

## 2017-01-13 ENCOUNTER — Ambulatory Visit: Payer: BLUE CROSS/BLUE SHIELD | Admitting: Family Medicine

## 2017-01-21 ENCOUNTER — Encounter: Payer: Self-pay | Admitting: Family Medicine

## 2017-01-21 ENCOUNTER — Ambulatory Visit (INDEPENDENT_AMBULATORY_CARE_PROVIDER_SITE_OTHER): Payer: BLUE CROSS/BLUE SHIELD | Admitting: Family Medicine

## 2017-01-21 VITALS — BP 142/68 | HR 61 | Temp 98.1°F | Resp 16 | Ht 67.0 in | Wt 163.8 lb

## 2017-01-21 DIAGNOSIS — E559 Vitamin D deficiency, unspecified: Secondary | ICD-10-CM | POA: Diagnosis not present

## 2017-01-21 DIAGNOSIS — E539 Vitamin B deficiency, unspecified: Secondary | ICD-10-CM

## 2017-01-21 DIAGNOSIS — I10 Essential (primary) hypertension: Secondary | ICD-10-CM | POA: Diagnosis not present

## 2017-01-21 DIAGNOSIS — F1021 Alcohol dependence, in remission: Secondary | ICD-10-CM

## 2017-01-21 DIAGNOSIS — E785 Hyperlipidemia, unspecified: Secondary | ICD-10-CM

## 2017-01-21 DIAGNOSIS — J449 Chronic obstructive pulmonary disease, unspecified: Secondary | ICD-10-CM

## 2017-01-21 MED ORDER — VERAPAMIL HCL ER 180 MG PO TBCR: EXTENDED_RELEASE_TABLET | ORAL | 3 refills | Status: DC

## 2017-01-21 NOTE — Progress Notes (Signed)
Name: Christian Griffith   MRN: [id number]    DOB: 18-Apr-1956   Date:01/21/2017       Progress Note  Subjective  Chief Complaint  Chief Complaint  Patient presents with  . Medication Refill    6 month F/U  . Hypertension    Denies any symptoms    HPI  HTN: taking medication, no side effects, denies constipation or swelling, no palpitation. BP is not checked at home, slightly up today, but usually at goal. Continue medication  Hyperlipidemia: not on medication, on Krill oil only, he refused prescription, last LDL had improved  COPD: symptoms are mild, he still smokes one pack daily, he denies cough, wheezing or SOB  History of alcoholism: that caused him to be admitted with encephalopathy, doing well since he quit drinking, still taking supplements. Feeling better, memory is good now  Patient Active Problem List   Diagnosis Date Noted  . Dyslipidemia 07/07/2015  . H/O alcohol abuse 07/07/2015  . Hyponatremia 07/07/2015  . Tobacco abuse 07/07/2015  . Deficiency of vitamin B 07/07/2015  . Vitamin D deficiency 07/07/2015  . History of alcoholism ([place]) 07/07/2015  . Hay fever 04/12/2009  . Benign essential HTN 04/12/2009  . COPD, mild ([place]) 04/12/2009    Past Surgical History:  Procedure Laterality Date  . VASECTOMY      Family History  Problem Relation Age of Onset  . Dementia Mother   . Hypertension Father     Social History   Social History  . Marital status: Married    Spouse name: N/A  . Number of children: N/A  . Years of education: N/A   Occupational History  . Not on file.   Social History Main Topics  . Smoking status: Current Every Day Smoker    Packs/day: 0.50    Years: 40.00    Types: Cigarettes    Start date: 07/10/1975  . Smokeless tobacco: Never Used  . Alcohol use No  . Drug use: No  . Sexual activity: Yes    Partners: Female   Other Topics Concern  . Not on file   Social History Narrative  . No narrative on file     Current  Outpatient Prescriptions:  .  aspirin 81 MG tablet, Take 81 mg by mouth daily., Disp: , Rfl:  .  cholecalciferol (VITAMIN D) 1000 UNITS tablet, Take by mouth., Disp: , Rfl:  .  Krill Oil 300 MG CAPS, Take by mouth., Disp: , Rfl:  .  Thiamine Mononitrate (VITAMIN B1) 100 MG TABS, Take by mouth 3 (three) times a week. , Disp: , Rfl:  .  verapamil (CALAN-SR) 180 MG CR tablet, TAKE ONE (1) TABLET EACH DAY, Disp: 90 tablet, Rfl: 3  No Known Allergies   ROS  Constitutional: Negative for fever or weight change.  Respiratory: Negative for cough and shortness of breath.   Cardiovascular: Negative for chest pain or palpitations.  Gastrointestinal: Negative for abdominal pain, no bowel changes.  Musculoskeletal: Negative for gait problem or joint swelling.  Skin: Negative for rash.  Neurological: Negative for dizziness or headache.  No other specific complaints in a complete review of systems (except as listed in HPI above).  Objective  Vitals:   01/21/17 1432  BP: (!) 142/68  Pulse: 61  Resp: 16  Temp: 98.1 F (36.7 C)  TempSrc: Oral  SpO2: 96%  Weight: 163 lb 12.8 oz (74.3 kg)  Height: 5\' 7"  (1.702 m)    Body mass index is 25.65 kg/m.  Physical Exam  Constitutional: Patient appears well-developed and well-nourished.No distress.  HEENT: head atraumatic, normocephalic, pupils equal and reactive to light,  neck supple, throat within normal limits Cardiovascular: Normal rate, regular rhythm and normal heart sounds.  No murmur heard. No BLE edema. Pulmonary/Chest: Effort normal and breath sounds normal. No respiratory distress. Abdominal: Soft.  There is no tenderness. Psychiatric: Patient has a normal mood and affect. behavior is normal. Judgment and thought content normal.  PHQ2/9: Depression screen [facility] 2/9 01/21/2017 07/16/2016 01/08/2016 07/10/2015  Decreased Interest 0 0 0 0  Down, Depressed, Hopeless 0 0 0 0  PHQ - 2 Score 0 0 0 0    Fall Risk: Fall Risk  01/21/2017  07/16/2016 01/08/2016 07/10/2015  Falls in the past year? No Yes No No  Number falls in past yr: - 1 - -  Injury with Fall? - Yes - -     Functional Status Survey: Is the patient deaf or have difficulty hearing?: No Does the patient have difficulty seeing, even when wearing glasses/contacts?: No Does the patient have difficulty concentrating, remembering, or making decisions?: No Does the patient have difficulty walking or climbing stairs?: No Does the patient have difficulty dressing or bathing?: No Does the patient have difficulty doing errands alone such as visiting a doctor's office or shopping?: No   Assessment & Plan  1. Benign essential HTN  bp slightly up, [place]  - verapamil (CALAN-SR) 180 MG CR tablet; TAKE ONE (1) TABLET EACH DAY  Dispense: 90 tablet; Refill: 3  2. COPD, mild ([place])  He is still smoking, denies cough or SOB  3. Vitamin D deficiency  Continue supplementation   4. Deficiency of vitamin B  Back to normal  5. Dyslipidemia  Improved  6. History of alcoholism ([place])  Not drinking at all now

## 2017-07-18 ENCOUNTER — Ambulatory Visit (INDEPENDENT_AMBULATORY_CARE_PROVIDER_SITE_OTHER): Payer: BLUE CROSS/BLUE SHIELD | Admitting: Family Medicine

## 2017-07-18 ENCOUNTER — Encounter: Payer: Self-pay | Admitting: Family Medicine

## 2017-07-18 VITALS — BP 120/80 | HR 66 | Temp 97.8°F | Resp 14 | Ht 66.5 in | Wt 157.5 lb

## 2017-07-18 DIAGNOSIS — G3184 Mild cognitive impairment, so stated: Secondary | ICD-10-CM

## 2017-07-18 DIAGNOSIS — Z1211 Encounter for screening for malignant neoplasm of colon: Secondary | ICD-10-CM | POA: Diagnosis not present

## 2017-07-18 DIAGNOSIS — Z0000 Encounter for general adult medical examination without abnormal findings: Secondary | ICD-10-CM | POA: Diagnosis not present

## 2017-07-18 DIAGNOSIS — Z23 Encounter for immunization: Secondary | ICD-10-CM

## 2017-07-18 LAB — COMPLETE METABOLIC PANEL WITH GFR
AG Ratio: 1.6 (calc) (ref 1.0–2.5)
ALBUMIN MSPROF: 4.1 g/dL (ref 3.6–5.1)
ALKALINE PHOSPHATASE (APISO): 70 U/L (ref 40–115)
ALT: 10 U/L (ref 9–46)
AST: 13 U/L (ref 10–35)
BILIRUBIN TOTAL: 0.5 mg/dL (ref 0.2–1.2)
BUN: 7 mg/dL (ref 7–25)
CHLORIDE: 105 mmol/L (ref 98–110)
CO2: 29 mmol/L (ref 20–32)
Calcium: 9.4 mg/dL (ref 8.6–10.3)
Creat: 0.78 mg/dL (ref 0.70–1.25)
GFR, EST AFRICAN AMERICAN: 111 mL/min/{1.73_m2} (ref >=60)
GFR, Est Non African American: 96 mL/min/{1.73_m2} (ref >=60)
GLOBULIN: 2.5 g/dL (ref 1.9–3.7)
Glucose, Bld: 97 mg/dL (ref 65–99)
Potassium: 4.3 mmol/L (ref 3.5–5.3)
SODIUM: 141 mmol/L (ref 135–146)
TOTAL PROTEIN: 6.6 g/dL (ref 6.1–8.1)

## 2017-07-18 LAB — LIPID PANEL
CHOL/HDL RATIO: 4.2 (calc) (ref <5.0)
CHOLESTEROL: 184 mg/dL (ref <200)
HDL: 44 mg/dL (ref >40)
LDL Cholesterol (Calc): 118 mg/dL (calc) — ABNORMAL HIGH
Non-HDL Cholesterol (Calc): 140 mg/dL (calc) — ABNORMAL HIGH (ref <130)
Triglycerides: 110 mg/dL (ref <150)

## 2017-07-18 NOTE — Progress Notes (Signed)
Name: Christian Griffith   MRN: [id number]    DOB: 1956-09-08   Date:07/18/2017       Progress Note  Subjective  Chief Complaint  Chief Complaint  Patient presents with  . Annual Exam    HPI  Patient presents for annual CPE   USPSTF grade A and B recommendations:  Diet: balanced diet , occasionally skips lunch when he has brunch  Current Exercise Habits: Structured exercise class, Type of exercise: walking (Golfing), Time (Minutes): > 60, Frequency (Times/Week): 3, Weekly Exercise (Minutes/Week): 0, Intensity: Moderate Exercise limited by: None identified   IPSS Questionnaire (AUA-7): Over the past month.   1)  How often have you had a sensation of not emptying your bladder completely after you finish urinating?  0 - Not at all  2)  How often have you had to urinate again less than two hours after you finished urinating? 0 - Not at all  3)  How often have you found you stopped and started again several times when you urinated?  0 - Not at all  4) How difficult have you found it to postpone urination?  0 - Not at all  5) How often have you had a weak urinary stream?  0 - Not at all  6) How often have you had to push or strain to begin urination?  0 - Not at all  7) How many times did you most typically get up to urinate from the time you went to bed until the time you got up in the morning?  1 - 1 time  Total score:  0-7 mildly symptomatic   8-19 moderately symptomatic   20-35 severely symptomatic   Depression:  Depression screen [facility] 2/9 01/21/2017 07/16/2016 01/08/2016 07/10/2015  Decreased Interest 0 0 0 0  Down, Depressed, Hopeless 0 0 0 0  PHQ - 2 Score 0 0 0 0    Hypertension: BP Readings from Last 3 Encounters:  07/18/17 120/80  01/21/17 (!) 142/68  07/16/16 118/66    Obesity: Wt Readings from Last 3 Encounters:  07/18/17 157 lb 8 oz (71.4 kg)  01/21/17 163 lb 12.8 oz (74.3 kg)  07/16/16 160 lb 8 oz (72.8 kg)   BMI Readings from Last 3 Encounters:  07/18/17  25.04 kg/m  01/21/17 25.65 kg/m  07/16/16 25.14 kg/m     Lipids:  Lab Results  Component Value Date   CHOL 188 07/16/2016   CHOL 206 (H) 07/10/2015   CHOL 197 07/05/2014   Lab Results  Component Value Date   HDL 40 07/16/2016   HDL 41 07/10/2015   HDL 39 07/05/2014   Lab Results  Component Value Date   LDLCALC 129 07/16/2016   LDLCALC 137 (H) 07/10/2015   LDLCALC 131 07/05/2014   Lab Results  Component Value Date   TRIG 95 07/16/2016   TRIG 140 07/10/2015   TRIG 133 07/05/2014   Lab Results  Component Value Date   CHOLHDL 4.7 07/16/2016   CHOLHDL 5.0 07/10/2015   No results found for: LDLDIRECT Glucose:  Glucose  Date Value Ref Range Status  07/10/2015 93 65 - 99 mg/dL Final  01/10/2014 97 65 - 99 mg/dL Final  11/25/2013 87 65 - 99 mg/dL Final  11/23/2013 83 65 - 99 mg/dL Final   Glucose, Bld  Date Value Ref Range Status  07/16/2016 97 65 - 99 mg/dL Final    Alcohol: used to be a heavy drinker, diagnosed with Wernick Korsakoff , thiamine deficiency back  in 2015 Tobacco use: still smoking and not ready to quit  Married STD testing and prevention (chl/gon/syphilis):  HIV, hep C: refused HIV< hepatitis C 07/05/2014  Skin cancer: no lesions Colorectal cancer: never had one, willing to do hemoccult stools Prostate cancer: last PSA was normal, but no covered by insurance, discussed USPTF  Lab Results  Component Value Date   PSA 1.1 07/16/2016   PSA 1.8, Normal 07/05/2014   Lung cancer:   Low Dose CT Chest recommended if Age 21-80 years, 30 pack-year currently smoking OR have quit w/in 15years. Patient does qualify.  Wife will check with insurance [employer]:  The USPSTF recommends one-time screening with ultrasonography in men ages 73 to 20 years who have ever smoked - does not qualify   Aspirin: every other day  ECG:  01/10/2014  Advanced Care Planning: .  Patient does have a living will at present time. If patient does have living will, I have requested  they bring this to the clinic to be scanned in to their chart.  Patient Active Problem List   Diagnosis Date Noted  . Dyslipidemia 07/07/2015  . H/O alcohol abuse 07/07/2015  . Hyponatremia 07/07/2015  . Tobacco abuse 07/07/2015  . Deficiency of vitamin B 07/07/2015  . Vitamin D deficiency 07/07/2015  . History of alcoholism ([place]) 07/07/2015  . Hay fever 04/12/2009  . Benign essential HTN 04/12/2009  . COPD, mild ([place]) 04/12/2009    Past Surgical History:  Procedure Laterality Date  . VASECTOMY      Family History  Problem Relation Age of Onset  . Dementia Mother   . Hypertension Father     Social History   Social History  . Marital status: Married    Spouse name: N/A  . Number of children: N/A  . Years of education: N/A   Occupational History  . Not on file.   Social History Main Topics  . Smoking status: Current Every Day Smoker    Packs/day: 1.00    Years: 40.00    Types: Cigarettes    Start date: 07/10/1975  . Smokeless tobacco: Never Used  . Alcohol use No     Comment: used to be a heavy drinker  . Drug use: No  . Sexual activity: Yes    Partners: Female   Other Topics Concern  . Not on file   Social History Narrative  . No narrative on file     Current Outpatient Prescriptions:  .  aspirin 81 MG tablet, Take 81 mg by mouth daily., Disp: , Rfl:  .  cholecalciferol (VITAMIN D) 1000 UNITS tablet, Take by mouth., Disp: , Rfl:  .  Krill Oil 300 MG CAPS, Take by mouth., Disp: , Rfl:  .  Thiamine Mononitrate (VITAMIN B1) 100 MG TABS, Take by mouth 3 (three) times a week. , Disp: , Rfl:  .  verapamil (CALAN-SR) 180 MG CR tablet, TAKE ONE (1) TABLET EACH DAY, Disp: 90 tablet, Rfl: 3  No Known Allergies   ROS   Constitutional: Negative for fever or weight change.  Respiratory: Negative for cough and shortness of breath.   Cardiovascular: Negative for chest pain or palpitations.  Gastrointestinal: Negative for abdominal pain, no bowel changes.   Musculoskeletal: Negative for gait problem or joint swelling.  Skin: Negative for rash.  Neurological: Negative for dizziness or headache.  No other specific complaints in a complete review of systems (except as listed in HPI above).   Objective  Vitals:   07/18/17 6384  BP: 120/80  Pulse: 66  Resp: 14  Temp: 97.8 F (36.6 C)  TempSrc: Oral  SpO2: 96%  Weight: 157 lb 8 oz (71.4 kg)  Height: 5' 6.5" (1.689 m)    Body mass index is 25.04 kg/m.  Physical Exam  Constitutional: Patient appears well-developed and well-nourished. No distress.  HENT: Head: Normocephalic and atraumatic. Ears: B TMs ok, no erythema or effusion; Nose: Nose normal. Mouth/Throat: Oropharynx is clear and moist. No oropharyngeal exudate.  Eyes: Conjunctivae and EOM are normal. Pupils are equal, round, and reactive to light. No scleral icterus.  Neck: Normal range of motion. Neck supple. No JVD present. No thyromegaly present.  Cardiovascular: Normal rate, regular rhythm and normal heart sounds.  No murmur heard. No BLE edema. Pulmonary/Chest: Effort normal and breath sounds normal. No respiratory distress. Abdominal: Soft. Bowel sounds are normal, no distension. There is no tenderness. no masses MALE GENITALIA: Normal descended testes bilaterally, no masses palpated, no hernias, no lesions, no discharge RECTAL: rectal exam not done Musculoskeletal: Normal range of motion, no joint effusions. No gross deformities Neurological: he is alert and oriented to person, place, and time. No cranial nerve deficit. Coordination, balance, strength, speech and gait are normal.  Skin: Skin is warm and dry. He has a scaly rash on lower abdomen and mid-back, discussed possible psoriasis, they will try otc medication for now ( hydrocortisone) and return if no improvement.  Psychiatric: Patient has a normal mood and affect. behavior is normal. Judgment and thought content normal.  6CIT Screen 07/18/2017  What Year? 4 points   What month? 0 points  What time? 0 points  Count back from 20 0 points  Months in reverse 0 points  Repeat phrase 6 points  Total Score 10   He has a history of alcoholism  PHQ2/9: Depression screen [facility] 2/9 01/21/2017 07/16/2016 01/08/2016 07/10/2015  Decreased Interest 0 0 0 0  Down, Depressed, Hopeless 0 0 0 0  PHQ - 2 Score 0 0 0 0    Fall Risk: Fall Risk  01/21/2017 07/16/2016 01/08/2016 07/10/2015  Falls in the past year? No Yes No No  Number falls in past yr: - 1 - -  Injury with Fall? - Yes - -    Functional Status Survey: Is the patient deaf or have difficulty hearing?: No Does the patient have difficulty seeing, even when wearing glasses/contacts?: No Does the patient have difficulty concentrating, remembering, or making decisions?: Yes Does the patient have difficulty walking or climbing stairs?: No Does the patient have difficulty dressing or bathing?: No Does the patient have difficulty doing errands alone such as visiting a doctor's office or shopping?: No    Assessment & Plan  1. Well adult exam  - Lipid panel - COMPLETE METABOLIC PANEL WITH GFR  2. Needs flu shot  refused  3. Need for pneumococcal vaccine  - Pneumococcal conjugate vaccine 13-valent IM  4. Colon cancer screening  Refused colonoscopy or cologuard, we will try hemoccult cards -hemoccult cards   5. Mild cognitive impairment  He was seen by Dr. Manuella Ghazi in the past, advised to consider going back but wife states they will hold off for now  -Prostate cancer screening and PSA options (with potential risks and benefits of testing vs not testing) were discussed along with recent recs/guidelines. -USPSTF grade A and B recommendations reviewed with patient; age-appropriate recommendations, preventive care, screening tests, etc discussed and encouraged; healthy living encouraged; see AVS for patient education given to patient -Discussed importance of  150 minutes of physical activity weekly, eat two  servings of fish weekly, eat one serving of tree nuts ( cashews, pistachios, pecans, almonds.Marland Kitchen) every other day, eat 6 servings of fruit/vegetables daily and drink plenty of water and avoid sweet beverages.  -Red flags and when to present for emergency care or RTC including fever >101.68F, chest pain, shortness of breath, new/worsening/un-resolving symptoms,  reviewed with patient at time of visit. Follow up and care instructions discussed and provided in AVS. -Reviewed Health Maintenance: he refuses colonoscopy or cologuard

## 2017-09-22 ENCOUNTER — Ambulatory Visit: Payer: BLUE CROSS/BLUE SHIELD | Admitting: Family Medicine

## 2017-09-27 ENCOUNTER — Encounter: Payer: Self-pay | Admitting: Family Medicine

## 2017-09-27 ENCOUNTER — Ambulatory Visit: Payer: BLUE CROSS/BLUE SHIELD | Admitting: Family Medicine

## 2017-09-27 VITALS — BP 120/60 | HR 63 | Resp 14 | Ht 63.0 in | Wt 161.5 lb

## 2017-09-27 DIAGNOSIS — J449 Chronic obstructive pulmonary disease, unspecified: Secondary | ICD-10-CM

## 2017-09-27 DIAGNOSIS — E539 Vitamin B deficiency, unspecified: Secondary | ICD-10-CM

## 2017-09-27 DIAGNOSIS — F1021 Alcohol dependence, in remission: Secondary | ICD-10-CM | POA: Diagnosis not present

## 2017-09-27 DIAGNOSIS — E559 Vitamin D deficiency, unspecified: Secondary | ICD-10-CM

## 2017-09-27 DIAGNOSIS — E785 Hyperlipidemia, unspecified: Secondary | ICD-10-CM

## 2017-09-27 DIAGNOSIS — I10 Essential (primary) hypertension: Secondary | ICD-10-CM

## 2017-09-27 MED ORDER — VERAPAMIL HCL ER 180 MG PO TBCR: EXTENDED_RELEASE_TABLET | ORAL | 3 refills | Status: DC

## 2017-09-27 NOTE — Progress Notes (Signed)
Name: Christian Griffith   MRN: [id number]    DOB: November 11, 1955   Date:09/27/2017       Progress Note  Subjective  Chief Complaint  Chief Complaint  Patient presents with  . Hypertension  . COPD    HPI   HTN: taking medication, no side effects, denies constipation or swelling, no palpitation. BP is at goal.   Hyperlipidemia: not on medication, on Krill oil only, he refused prescription, last LDL was higher last visit, discussed resume life style modification   COPD: symptoms are mild, he still smokes but down to half a pack daily, he denies cough - but was coughing in our office, wheezing or SOB  History of alcoholism: that caused him to be admitted with encephalopathy, doing well since he quit drinking, still taking supplements. Feeling better, memory is good now, last labs B1, B12 back to normal and he does not want to have labs done at this time   Patient Active Problem List   Diagnosis Date Noted  . Dyslipidemia 07/07/2015  . H/O alcohol abuse 07/07/2015  . Tobacco abuse 07/07/2015  . Deficiency of vitamin B 07/07/2015  . Vitamin D deficiency 07/07/2015  . History of alcoholism ([place]) 07/07/2015  . Hay fever 04/12/2009  . Benign essential HTN 04/12/2009  . COPD, mild ([place]) 04/12/2009    Past Surgical History:  Procedure Laterality Date  . VASECTOMY      Family History  Problem Relation Age of Onset  . Dementia Mother   . Hypertension Father     Social History   Socioeconomic History  . Marital status: Married    Spouse name: Not on file  . Number of children: Not on file  . Years of education: Not on file  . Highest education level: Not on file  Social Needs  . Financial resource strain: Not on file  . Food insecurity - worry: Not on file  . Food insecurity - inability: Not on file  . Transportation needs - medical: Not on file  . Transportation needs - non-medical: Not on file  Occupational History  . Not on file  Tobacco Use  . Smoking status:  Current Every Day Smoker    Packs/day: 1.00    Years: 40.00    Pack years: 40.00    Types: Cigarettes    Start date: 07/10/1975  . Smokeless tobacco: Never Used  Substance and Sexual Activity  . Alcohol use: No    Alcohol/week: 0.0 oz    Comment: used to be a heavy drinker  . Drug use: No  . Sexual activity: Yes    Partners: Female  Other Topics Concern  . Not on file  Social History Narrative  . Not on file     Current Outpatient Medications:  .  aspirin 81 MG tablet, Take 81 mg by mouth daily., Disp: , Rfl:  .  cholecalciferol (VITAMIN D) 1000 UNITS tablet, Take by mouth., Disp: , Rfl:  .  Krill Oil 300 MG CAPS, Take by mouth., Disp: , Rfl:  .  Thiamine Mononitrate (VITAMIN B1) 100 MG TABS, Take by mouth 3 (three) times a week. , Disp: , Rfl:  .  verapamil (CALAN-SR) 180 MG CR tablet, TAKE ONE (1) TABLET EACH DAY, Disp: 90 tablet, Rfl: 3  No Known Allergies   ROS  Constitutional: Negative for fever or weight change.  Respiratory: Positive  for cough but no  shortness of breath.   Cardiovascular: Negative for chest pain or palpitations.  Gastrointestinal: Negative  for abdominal pain, no bowel changes.  Musculoskeletal: Negative for gait problem or joint swelling.  Skin: Negative for rash.  Neurological: Negative for dizziness or headache.  No other specific complaints in a complete review of systems (except as listed in HPI above).  Objective  Vitals:   09/27/17 1046  BP: 120/60  Pulse: 63  Resp: 14  Weight: 161 lb 8 oz (73.3 kg)  Height: 5\' 3"  (1.6 m)    Body mass index is 28.61 kg/m.  Physical Exam  Constitutional: Patient appears well-developed and well-nourished. Overweight.  No distress.  HEENT: head atraumatic, normocephalic, pupils equal and reactive to light, neck supple, throat within normal limits Cardiovascular: Normal rate, regular rhythm and normal heart sounds.  No murmur heard. No BLE edema. Pulmonary/Chest: Effort normal and breath sounds  normal. No respiratory distress. Abdominal: Soft.  There is no tenderness. Psychiatric: Patient has a normal mood and affect. behavior is normal. Judgment and thought content normal.  Recent Results (from the past 2160 hour(s))  Lipid panel     Status: Abnormal   Collection Time: 07/18/17  9:45 AM  Result Value Ref Range   Cholesterol 184 <200 mg/dL   HDL 44 >40 mg/dL   Triglycerides 110 <150 mg/dL   LDL Cholesterol (Calc) 118 (H) mg/dL (calc)    Comment: Reference range: <100 . Desirable range <100 mg/dL for primary prevention;   <70 mg/dL for patients with CHD or diabetic patients  with > or = 2 CHD risk factors. Marland Kitchen LDL-C is now calculated using the Martin-Hopkins  calculation, which is a validated novel method providing  better accuracy than the Friedewald equation in the  estimation of LDL-C.  Cresenciano Genre et al. Annamaria Helling. 9371;696(78): 2061-2068  (http://education.[employer].com/faq/FAQ164)    Total CHOL/HDL Ratio 4.2 <5.0 (calc)   Non-HDL Cholesterol (Calc) 140 (H) <130 mg/dL (calc)    Comment: For patients with diabetes plus 1 major ASCVD risk  factor, treating to a non-HDL-C goal of <100 mg/dL  (LDL-C of <70 mg/dL) is considered a therapeutic  option.   COMPLETE METABOLIC PANEL WITH GFR     Status: None   Collection Time: 07/18/17  9:45 AM  Result Value Ref Range   Glucose, Bld 97 65 - 99 mg/dL    Comment: .            Fasting reference interval .    BUN 7 7 - 25 mg/dL   Creat 0.78 0.70 - 1.25 mg/dL    Comment: For patients >25 years of age, the reference limit for Creatinine is approximately 13% higher for people identified as African-American. .    GFR, Est Non African American 96 > OR = 60 mL/min/1.49m2   GFR, Est African American 111 > OR = 60 mL/min/1.66m2   BUN/Creatinine Ratio NOT APPLICABLE 6 - 22 (calc)   Sodium 141 135 - 146 mmol/L   Potassium 4.3 3.5 - 5.3 mmol/L   Chloride 105 98 - 110 mmol/L   CO2 29 20 - 32 mmol/L   Calcium 9.4 8.6 - 10.3  mg/dL   Total Protein 6.6 6.1 - 8.1 g/dL   Albumin 4.1 3.6 - 5.1 g/dL   Globulin 2.5 1.9 - 3.7 g/dL (calc)   AG Ratio 1.6 1.0 - 2.5 (calc)   Total Bilirubin 0.5 0.2 - 1.2 mg/dL   Alkaline phosphatase (APISO) 70 40 - 115 U/L   AST 13 10 - 35 U/L   ALT 10 9 - 46 U/L     PHQ2/9: Depression  screen [facility] 2/9 01/21/2017 07/16/2016 01/08/2016 07/10/2015  Decreased Interest 0 0 0 0  Down, Depressed, Hopeless 0 0 0 0  PHQ - 2 Score 0 0 0 0     Fall Risk: Fall Risk  09/27/2017 01/21/2017 07/16/2016 01/08/2016 07/10/2015  Falls in the past year? No No Yes No No  Number falls in past yr: - - 1 - -  Injury with Fall? - - Yes - -     Functional Status Survey: Is the patient deaf or have difficulty hearing?: No Does the patient have difficulty seeing, even when wearing glasses/contacts?: No Does the patient have difficulty concentrating, remembering, or making decisions?: No Does the patient have difficulty walking or climbing stairs?: No Does the patient have difficulty dressing or bathing?: No Does the patient have difficulty doing errands alone such as visiting a doctor's office or shopping?: No    Assessment & Plan  1. Benign essential HTN  - verapamil (CALAN-SR) 180 MG CR tablet; TAKE ONE (1) TABLET EACH DAY  Dispense: 90 tablet; Refill: 3  2. COPD, mild ([place])  Discussed importance of quitting smoking  3. Vitamin D deficiency  Continue supplementation  4. Deficiency of vitamin B  Take supplement as directed  5. Dyslipidemia  Discussed life style modification   6. History of alcoholism ([place])  Encouraged him to stay off alcohol

## 2018-03-27 ENCOUNTER — Ambulatory Visit: Payer: BLUE CROSS/BLUE SHIELD | Admitting: Nurse Practitioner

## 2018-03-27 ENCOUNTER — Other Ambulatory Visit: Payer: Self-pay | Admitting: Nurse Practitioner

## 2018-03-27 ENCOUNTER — Telehealth: Payer: Self-pay | Admitting: Nurse Practitioner

## 2018-03-27 ENCOUNTER — Encounter: Payer: Self-pay | Admitting: Nurse Practitioner

## 2018-03-27 VITALS — BP 120/76 | HR 66 | Temp 98.2°F | Resp 18 | Ht 63.0 in | Wt 161.1 lb

## 2018-03-27 DIAGNOSIS — E785 Hyperlipidemia, unspecified: Secondary | ICD-10-CM | POA: Diagnosis not present

## 2018-03-27 DIAGNOSIS — J449 Chronic obstructive pulmonary disease, unspecified: Secondary | ICD-10-CM

## 2018-03-27 DIAGNOSIS — I10 Essential (primary) hypertension: Secondary | ICD-10-CM

## 2018-03-27 DIAGNOSIS — F17210 Nicotine dependence, cigarettes, uncomplicated: Secondary | ICD-10-CM

## 2018-03-27 DIAGNOSIS — Z720 Tobacco use: Secondary | ICD-10-CM

## 2018-03-27 NOTE — Telephone Encounter (Signed)
-----   Message from Arnetha Courser, MD sent at 03/27/2018  5:12 PM EDT ----- Regarding: :-) suggestion from yours truly :-) What do you think about a low dose chest CT for lung cancer screening? Thank you!  ----- Message ----- From: Fredderick Severance, NP Sent: 03/27/2018  11:20 AM To: Arnetha Courser, MD

## 2018-03-27 NOTE — Progress Notes (Addendum)
Name: Christian Griffith   MRN: [id number]    DOB: 23-Nov-1955   Date:03/27/2018       Progress Note  Subjective  Chief Complaint  Chief Complaint  Patient presents with  . Hypertension  . Follow-up    6 month recheck    HPI  HTN: verapamil 180mg  no missed doses. Limits salt in diet. Denies headaches, chest pain, dizziness, blurry vision.  BP Readings from Last 3 Encounters:  03/27/18 120/76  09/27/17 120/60  07/18/17 120/80   HLD: krill oil daily Diet: three meals a day, rarely snack in between- sts eats when he is hungry. Normally eggs and toast and fruit in the morning, [place] sandwich, supper- avoids red meat, lots of chicken and a vegetable. Doesn't drinks water; drinks coffee, ice tea, root beer. Doesn't drink alcohol   Tobacco use: 1ppd not interested in quiting. Denies cough, shob  Takes daily ASA Guidance: Advise low-dose aspirin (75-160 mg/d) ASCVD Risk Score: >20% over 10 years without aspirin use. Bleeding Risk Score: 2.10% over 10 years without aspirin use. 2 risk factors for increased risk of bleeding ?Age>60 ?Male    Patient Active Problem List   Diagnosis Date Noted  . Dyslipidemia 07/07/2015  . H/O alcohol abuse 07/07/2015  . Tobacco abuse 07/07/2015  . Deficiency of vitamin B 07/07/2015  . Vitamin D deficiency 07/07/2015  . History of alcoholism ([place]) 07/07/2015  . Hay fever 04/12/2009  . Benign essential HTN 04/12/2009  . COPD, mild ([place]) 04/12/2009    Past Medical History:  Diagnosis Date  . Allergy   . Deficiency of vitamin B   . Dyslipidemia   . Encephalomyeloneuropathy   . History of alcohol abuse   . Hypertension   . Other emphysema ([place])   . Seizure disorder, primary ([place])   . Vitamin D deficiency   . Wernicke-Korsakoff syndrome (alcoholic) ([facility])     Past Surgical History:  Procedure Laterality Date  . VASECTOMY      Social History   Tobacco Use  . Smoking status: Current Every Day Smoker    Packs/day: 1.00    Years: 40.00   Pack years: 40.00    Types: Cigarettes    Start date: 07/10/1975  . Smokeless tobacco: Never Used  Substance Use Topics  . Alcohol use: No    Alcohol/week: 0.0 oz    Comment: used to be a heavy drinker     Current Outpatient Medications:  .  aspirin 81 MG tablet, Take 81 mg by mouth daily., Disp: , Rfl:  .  cholecalciferol (VITAMIN D) 1000 UNITS tablet, Take by mouth., Disp: , Rfl:  .  Krill Oil 300 MG CAPS, Take by mouth., Disp: , Rfl:  .  Thiamine Mononitrate (VITAMIN B1) 100 MG TABS, Take by mouth 3 (three) times a week. , Disp: , Rfl:  .  verapamil (CALAN-SR) 180 MG CR tablet, TAKE ONE (1) TABLET EACH DAY, Disp: 90 tablet, Rfl: 3  No Known Allergies  ROS  Constitutional: Negative for fever or weight change.  Respiratory: Negative for cough and shortness of breath.   Cardiovascular: Negative for chest pain or palpitations.  Gastrointestinal: Negative for abdominal pain, no bowel changes.  Musculoskeletal: Negative for gait problem or joint swelling.  Skin: Negative for rash.  Neurological: Negative for dizziness or headache.  No other specific complaints in a complete review of systems (except as listed in HPI above).  Objective  Vitals:   03/27/18 1032  BP: 120/76  Pulse: 66  Resp: 18  Temp: 98.2 F (36.8 C)  TempSrc: Oral  SpO2: 97%  Weight: 161 lb 1.6 oz (73.1 kg)  Height: 5\' 3"  (1.6 m)     Body mass index is 28.54 kg/m.  Nursing Note and Vital Signs reviewed.  Physical Exam   Constitutional: Patient appears well-developed and well-nourished. No distress.  Cardiovascular: Normal rate, regular rhythm, S1/S2 present.  Pulses intact. No carotid bruits Pulmonary/Chest: Effort normal and breath sounds clear. No respiratory distress or retractions.  Psychiatric: Patient has a normal mood and affect. behavior is normal. Judgment and thought content normal.  No results found for this or any previous visit (from the past 72 hour(s)).  Assessment & Plan  1.  Benign essential HTN -stable continue current therapies   2. COPD, mild ([place]) Discussed smoking cessation, pt not interested   3. Dyslipidemia -discussed diet, cont krill oil, recheck at physical   4. Tobacco abuse -not ready to quit     -Red flags and when to present for emergency care or RTC including fever >101.62F, chest pain, shortness of breath, new/worsening/un-resolving symptoms,  reviewed with patient at time of visit. Follow up and care instructions discussed and provided in AVS. ------------------------------------------------ Consider low dose chest CT for screening purposes I have reviewed this encounter including the documentation in this note and/or discussed this patient with the provider, Suezanne Cheshire DNP AGNP-C. I am certifying that I agree with the content of this note as supervising physician. Enid Derry, [facility] 03/27/2018, 5:47 PM

## 2018-03-27 NOTE — Patient Instructions (Signed)
-   Blood is pressure is well controlled, continue medication and healthy diet - Encourage drinking water

## 2018-03-27 NOTE — Telephone Encounter (Signed)
Please call _____ Due to patient long-standing smoking he qualifies to get a screening CT for lung cancer. Orders have been placed and he should receive a phone call about scheduling this. Thanks

## 2018-03-30 NOTE — Telephone Encounter (Signed)
Left message for patient and wife.

## 2018-03-31 ENCOUNTER — Telehealth: Payer: Self-pay | Admitting: *Deleted

## 2018-03-31 NOTE — Telephone Encounter (Signed)
Received referral for low dose lung cancer screening CT scan. Message left at phone number listed in EMR for patient to call me back to facilitate scheduling scan.  

## 2018-04-21 ENCOUNTER — Telehealth: Payer: Self-pay

## 2018-04-21 NOTE — Telephone Encounter (Signed)
Called patient and left a vm to inform him on the recommendations of the CT lung scan for hx of smoking.

## 2018-04-21 NOTE — Telephone Encounter (Signed)
Copied from [place] [id number]. Topic: Referral - Question >> Apr 21, 2018  9:40 AM Synthia Innocent wrote: Reason for CRM: Per pt wife, patient is refusing to have CT done

## 2018-04-21 NOTE — Telephone Encounter (Addendum)
Please call __________ Thanks for letting us know. Discussed with patient that he qualifies for a screening CT to see if he has lung cancer due to his smoking history he is at higher risk. If this is something he does not want; he doesn't have to get it but it is a helpful screening and medically recommended as symptoms are often late signs of disease.

## 2018-04-23 ENCOUNTER — Telehealth: Payer: Self-pay | Admitting: *Deleted

## 2018-04-23 NOTE — Telephone Encounter (Signed)
Received referral for low dose lung cancer screening CT scan. Unable to contact patient, but did speak with wife. She reports patient has decided not to have lung screening scan. She denies need for further discussion or information.

## 2018-05-11 ENCOUNTER — Telehealth: Payer: Self-pay | Admitting: *Deleted

## 2018-05-11 NOTE — Telephone Encounter (Signed)
Received another referral for a lung screening scan. Left voicemail for patient to call me if he is now interested in having a lung screening scan.

## 2018-08-17 ENCOUNTER — Ambulatory Visit (INDEPENDENT_AMBULATORY_CARE_PROVIDER_SITE_OTHER): Payer: BLUE CROSS/BLUE SHIELD | Admitting: Family Medicine

## 2018-08-17 ENCOUNTER — Encounter: Payer: Self-pay | Admitting: Family Medicine

## 2018-08-17 VITALS — BP 136/72 | HR 62 | Temp 97.8°F | Resp 16 | Ht 63.0 in | Wt 163.2 lb

## 2018-08-17 DIAGNOSIS — F1021 Alcohol dependence, in remission: Secondary | ICD-10-CM | POA: Diagnosis not present

## 2018-08-17 DIAGNOSIS — E539 Vitamin B deficiency, unspecified: Secondary | ICD-10-CM

## 2018-08-17 DIAGNOSIS — E785 Hyperlipidemia, unspecified: Secondary | ICD-10-CM

## 2018-08-17 DIAGNOSIS — J449 Chronic obstructive pulmonary disease, unspecified: Secondary | ICD-10-CM | POA: Diagnosis not present

## 2018-08-17 DIAGNOSIS — I10 Essential (primary) hypertension: Secondary | ICD-10-CM

## 2018-08-17 DIAGNOSIS — E559 Vitamin D deficiency, unspecified: Secondary | ICD-10-CM

## 2018-08-17 MED ORDER — VERAPAMIL HCL ER 180 MG PO TBCR: EXTENDED_RELEASE_TABLET | ORAL | 3 refills | Status: DC

## 2018-08-17 NOTE — Patient Instructions (Addendum)
Please contact insurance to find out about:  shingrix vaccine Colonoscopy Triple A screen Lab work coverage Spirometry  EKG  Low dose CT - lung cancer screen   On welcome to medicare

## 2018-08-17 NOTE — Progress Notes (Signed)
Name: Christian Griffith   MRN: [id number]    DOB: 1956-04-04   Date:08/17/2018       Progress Note  Subjective  Chief Complaint  Chief Complaint  Patient presents with  . Annual Exam  . Immunizations    declines flu shot    HPI   He came in for CPE however he is turning 30 in January and since he does not like having rectal exam we will switch to a regular office visit and return for welcome to medicare in Feb 2020   HTN: taking medication, no side effects, denies constipation or swelling, no palpitation.BP was high when he first came in but improved after rest   Hyperlipidemia: not on medication, on Krill oil only, he refused prescription, he is willing to have lab work done today  COPD: symptoms are mild, he still smokes but down to half a pack daily, he denies cough , wheezing or SOB. He does not want to have a spirometry   History of alcoholism: that caused him to be admitted with encephalopathy back in 2014 , doing well since he quit drinking, still taking supplements. Feeling better, memory is good now, last labs B1, B12 back to normal, he is willing to have labs repeated at this time  Vitamin D def still supplements and last 2 levels were normal therefore we will not repeat at this time  Tobacco: down to half pack daily but not ready to quit, discussed importance of stopping smoking   Patient Active Problem List   Diagnosis Date Noted  . Dyslipidemia 07/07/2015  . Tobacco abuse 07/07/2015  . Deficiency of vitamin B 07/07/2015  . Vitamin D deficiency 07/07/2015  . History of alcoholism ([place]) 07/07/2015  . Hay fever 04/12/2009  . Benign essential HTN 04/12/2009  . COPD, mild ([place]) 04/12/2009    Past Surgical History:  Procedure Laterality Date  . VASECTOMY      Family History  Problem Relation Age of Onset  . Dementia Mother   . Hypertension Father     Social History   Socioeconomic History  . Marital status: Married    Spouse name: Mateo Flow  . Number  of children: 2  . Years of education: Not on file  . Highest education level: High school graduate  Occupational History  . Occupation: Retired  Scientific laboratory technician  . Financial resource strain: Patient refused  . Food insecurity:    Worry: Patient refused    Inability: Patient refused  . Transportation needs:    Medical: Patient refused    Non-medical: Patient refused  Tobacco Use  . Smoking status: Current Every Day Smoker    Packs/day: 1.00    Years: 40.00    Pack years: 40.00    Types: Cigarettes    Start date: 07/10/1975  . Smokeless tobacco: Never Used  Substance and Sexual Activity  . Alcohol use: No    Alcohol/week: 0.0 standard drinks    Comment: used to be a heavy drinker  . Drug use: No  . Sexual activity: Yes    Partners: Female  Lifestyle  . Physical activity:    Days per week: 5 days    Minutes per session: 40 min  . Stress: Not at all  Relationships  . Social connections:    Talks on phone: More than three times a week    Gets together: More than three times a week    Attends religious service: Never    Active member of club or organization:  Yes    Attends meetings of clubs or organizations: More than 4 times per year    Relationship status: Married  . Intimate partner violence:    Fear of current or ex partner: No    Emotionally abused: No    Physically abused: No    Forced sexual activity: No  Other Topics Concern  . Not on file  Social History Narrative   2 children and 1 granddaughter     Current Outpatient Medications:  .  aspirin 81 MG tablet, Take 81 mg by mouth daily., Disp: , Rfl:  .  cholecalciferol (VITAMIN D) 1000 UNITS tablet, Take by mouth., Disp: , Rfl:  .  Krill Oil 300 MG CAPS, Take by mouth., Disp: , Rfl:  .  Thiamine Mononitrate (VITAMIN B1) 100 MG TABS, Take by mouth 3 (three) times a week. , Disp: , Rfl:  .  verapamil (CALAN-SR) 180 MG CR tablet, TAKE ONE (1) TABLET EACH DAY, Disp: 90 tablet, Rfl: 3  No Known Allergies  I  personally reviewed active problem list, medication list, allergies, family history, social history with the patient/caregiver today.   ROS  Constitutional: Negative for fever or weight change.  Respiratory: Negative for cough and shortness of breath.   Cardiovascular: Negative for chest pain or palpitations.  Gastrointestinal: Negative for abdominal pain, no bowel changes.  Musculoskeletal: Negative for gait problem or joint swelling.  Skin: Negative for rash.  Neurological: Negative for dizziness or headache.  No other specific complaints in a complete review of systems (except as listed in HPI above).  Objective  Vitals:   08/17/18 1034 08/17/18 1043  BP: (!) 144/72 136/72  Pulse: 62   Resp: 16   Temp: 97.8 F (36.6 C)   TempSrc: Oral   SpO2: 99%   Weight: 163 lb 3.2 oz (74 kg)   Height: 5\' 3"  (1.6 m)     Body mass index is 28.91 kg/m.  Physical Exam  Constitutional: Patient appears well-developed and well-nourished. Overweight.  No distress.  HEENT: head atraumatic, normocephalic, pupils equal and reactive to light, neck supple, throat within normal limits Cardiovascular: Normal rate, regular rhythm and normal heart sounds.  No murmur heard. No BLE edema. Pulmonary/Chest: Effort normal but he has scattered rhonchi No respiratory distress. Abdominal: Soft.  There is no tenderness. Psychiatric: Patient has a normal mood and affect. behavior is normal. Judgment and thought content normal.   PHQ2/9: Depression screen [facility] 2/9 08/17/2018 01/21/2017 07/16/2016 01/08/2016 07/10/2015  Decreased Interest 0 0 0 0 0  Down, Depressed, Hopeless 0 0 0 0 0  PHQ - 2 Score 0 0 0 0 0  Altered sleeping 0 - - - -  Tired, decreased energy 0 - - - -  Change in appetite 0 - - - -  Feeling bad or failure about yourself  0 - - - -  Trouble concentrating 0 - - - -  Moving slowly or fidgety/restless 0 - - - -  Suicidal thoughts 0 - - - -  PHQ-9 Score 0 - - - -  Difficult doing work/chores  Not difficult at all - - - -     Fall Risk: Fall Risk  08/17/2018 09/27/2017 01/21/2017 07/16/2016 01/08/2016  Falls in the past year? 0 No No Yes No  Number falls in past yr: - - - 1 -  Injury with Fall? - - - Yes -     Functional Status Survey: Is the patient deaf or have difficulty hearing?: No Does  the patient have difficulty seeing, even when wearing glasses/contacts?: No Does the patient have difficulty concentrating, remembering, or making decisions?: No Does the patient have difficulty walking or climbing stairs?: No Does the patient have difficulty dressing or bathing?: No Does the patient have difficulty doing errands alone such as visiting a doctor's office or shopping?: No    Assessment & Plan  1. Benign essential HTN  - COMPLETE METABOLIC PANEL WITH GFR - CBC with Differential/Platelet Refill medication    2. History of alcoholism ([place])  He is no longer drinking alcohol   3. COPD, mild ([place])  stable  4. Dyslipidemia  - Lipid panel  5. Vitamin D deficiency  Taking supplementation   6. Deficiency of vitamin B  - CBC with Differential/Platelet - Vitamin B12 - Vitamin B1

## 2019-02-04 ENCOUNTER — Encounter: Payer: Self-pay | Admitting: Family Medicine

## 2019-02-04 ENCOUNTER — Encounter: Payer: Self-pay | Admitting: Nurse Practitioner

## 2019-08-02 ENCOUNTER — Other Ambulatory Visit: Payer: Self-pay

## 2019-08-02 ENCOUNTER — Ambulatory Visit (INDEPENDENT_AMBULATORY_CARE_PROVIDER_SITE_OTHER): Payer: Medicare HMO | Admitting: Family Medicine

## 2019-08-02 ENCOUNTER — Encounter: Payer: Self-pay | Admitting: Family Medicine

## 2019-08-02 VITALS — BP 136/68 | HR 69 | Temp 96.6°F | Resp 16 | Ht 63.0 in | Wt 161.7 lb

## 2019-08-02 DIAGNOSIS — R69 Illness, unspecified: Secondary | ICD-10-CM | POA: Diagnosis not present

## 2019-08-02 DIAGNOSIS — J449 Chronic obstructive pulmonary disease, unspecified: Secondary | ICD-10-CM

## 2019-08-02 DIAGNOSIS — Z1211 Encounter for screening for malignant neoplasm of colon: Secondary | ICD-10-CM | POA: Diagnosis not present

## 2019-08-02 DIAGNOSIS — F1021 Alcohol dependence, in remission: Secondary | ICD-10-CM

## 2019-08-02 DIAGNOSIS — Z23 Encounter for immunization: Secondary | ICD-10-CM

## 2019-08-02 DIAGNOSIS — I10 Essential (primary) hypertension: Secondary | ICD-10-CM | POA: Diagnosis not present

## 2019-08-02 MED ORDER — VERAPAMIL HCL ER 180 MG PO TBCR: EXTENDED_RELEASE_TABLET | ORAL | 3 refills | Status: DC

## 2019-08-02 NOTE — Progress Notes (Signed)
Name: Christian Griffith   MRN: [id number]    DOB: September 15, 1956   Date:08/02/2019       Progress Note  Subjective  Chief Complaint  Chief Complaint  Patient presents with  . Medication Refill  . Hypertension  . COPD  . Hyperlipidemia  . Nicotine Dependence    HPI  HTN: taking medication, no side effects, denies constipation or swelling, no palpitation.BP is at goal, no side effects of medication   Hyperlipidemia: not on medication, on Krill oil only, he refused prescription, he will return for welcome to medicare  COPD: he smoking back in January 2020, he is very active, likes working outside. Denies sob but still has an occasional cough. No wheezing  History of alcoholism: that caused him to be admitted with encephalopathy back in 2014 , doing well since he quit drinking, still taking supplements. Feeling better, memory is good now, last labs B1, B12 back to normal, he does not want to have labs done today   Vitamin D def : he is taking otc vitamin D   History of alcoholism: he is doing well, no longer drinking, he denies any tingling or numbness    Patient Active Problem List   Diagnosis Date Noted  . Dyslipidemia 07/07/2015  . History of tobacco abuse 07/07/2015  . Deficiency of vitamin B 07/07/2015  . Vitamin D deficiency 07/07/2015  . History of alcoholism ([place]) 07/07/2015  . Hay fever 04/12/2009  . Benign essential HTN 04/12/2009  . COPD, mild ([place]) 04/12/2009    Past Surgical History:  Procedure Laterality Date  . VASECTOMY      Family History  Problem Relation Age of Onset  . Dementia Mother   . Hypertension Father     Social History   Socioeconomic History  . Marital status: Married    Spouse name: Mateo Flow  . Number of children: 2  . Years of education: Not on file  . Highest education level: High school graduate  Occupational History  . Occupation: Retired  Scientific laboratory technician  . Financial resource strain: Not hard at all  . Food insecurity     Worry: Never true    Inability: Never true  . Transportation needs    Medical: No    Non-medical: No  Tobacco Use  . Smoking status: Former Smoker    Packs/day: 1.00    Years: 40.00    Pack years: 40.00    Types: Cigarettes    Start date: 07/10/1975    Quit date: 10/14/2018    Years since quitting: 0.8  . Smokeless tobacco: Never Used  Substance and Sexual Activity  . Alcohol use: No    Alcohol/week: 0.0 standard drinks    Comment: used to be a heavy drinker  . Drug use: No  . Sexual activity: Yes    Partners: Female  Lifestyle  . Physical activity    Days per week: 7 days    Minutes per session: 150+ min  . Stress: Not at all  Relationships  . Social connections    Talks on phone: More than three times a week    Gets together: More than three times a week    Attends religious service: Never    Active member of club or organization: Yes    Attends meetings of clubs or organizations: More than 4 times per year    Relationship status: Married  . Intimate partner violence    Fear of current or ex partner: No    Emotionally abused:  No    Physically abused: No    Forced sexual activity: No  Other Topics Concern  . Not on file  Social History Narrative   2 children and 1 granddaughter and 1 grandson    They have a house in [place] and one at [facility] area     Current Outpatient Medications:  .  aspirin 81 MG tablet, Take 81 mg by mouth daily., Disp: , Rfl:  .  cholecalciferol (VITAMIN D) 1000 UNITS tablet, Take by mouth., Disp: , Rfl:  .  Krill Oil 300 MG CAPS, Take by mouth., Disp: , Rfl:  .  Thiamine Mononitrate (VITAMIN B1) 100 MG TABS, Take by mouth 3 (three) times a week. , Disp: , Rfl:  .  verapamil (CALAN-SR) 180 MG CR tablet, TAKE ONE (1) TABLET EACH DAY, Disp: 90 tablet, Rfl: 3  No Known Allergies  I personally reviewed active problem list, medication list, allergies, family history, social history, health maintenance with the patient/caregiver  today.   ROS  Constitutional: Negative for fever or weight change.  Respiratory: Positive  for cough but no  shortness of breath.   Cardiovascular: Negative for chest pain or palpitations.  Gastrointestinal: Negative for abdominal pain, no bowel changes.  Musculoskeletal: Negative for gait problem or joint swelling.  Skin: Negative for rash.  Neurological: Negative for dizziness or headache.  No other specific complaints in a complete review of systems (except as listed in HPI above).  Objective  Vitals:   08/02/19 0847  BP: 136/68  Pulse: 69  Resp: 16  Temp: (!) 96.6 F (35.9 C)  TempSrc: Temporal  SpO2: 97%  Weight: 161 lb 11.2 oz (73.3 kg)  Height: 5\' 3"  (1.6 m)    Body mass index is 28.64 kg/m.  Physical Exam  Constitutional: Patient appears well-developed and well-nourished. Overweight. No distress.  HEENT: head atraumatic, normocephalic, pupils equal and reactive to light Cardiovascular: Normal rate, regular rhythm and normal heart sounds.  No murmur heard. No BLE edema. Pulmonary/Chest: Effort normal and breath sounds normal. No respiratory distress. Abdominal: Soft.  There is no tenderness. Psychiatric: Patient has a normal mood and affect. behavior is normal. Judgment and thought content normal.  PHQ2/9: Depression screen [facility] 2/9 08/02/2019 08/17/2018 01/21/2017 07/16/2016 01/08/2016  Decreased Interest 0 0 0 0 0  Down, Depressed, Hopeless 0 0 0 0 0  PHQ - 2 Score 0 0 0 0 0  Altered sleeping 0 0 - - -  Tired, decreased energy 0 0 - - -  Change in appetite 0 0 - - -  Feeling bad or failure about yourself  0 0 - - -  Trouble concentrating 0 0 - - -  Moving slowly or fidgety/restless 0 0 - - -  Suicidal thoughts 0 0 - - -  PHQ-9 Score 0 0 - - -  Difficult doing work/chores Not difficult at all Not difficult at all - - -    phq 9 is negative   Fall Risk: Fall Risk  08/02/2019 08/17/2018 09/27/2017 01/21/2017 07/16/2016  Falls in the past year? 0 0 No No Yes   Number falls in past yr: 0 - - - 1  Injury with Fall? 0 - - - Yes     Functional Status Survey: Is the patient deaf or have difficulty hearing?: No Does the patient have difficulty seeing, even when wearing glasses/contacts?: Yes Does the patient have difficulty concentrating, remembering, or making decisions?: No Does the patient have difficulty walking or climbing stairs?:  No Does the patient have difficulty dressing or bathing?: No Does the patient have difficulty doing errands alone such as visiting a doctor's office or shopping?: No    Assessment & Plan  1. Benign essential HTN  - verapamil (CALAN-SR) 180 MG CR tablet; TAKE ONE (1) TABLET EACH DAY  Dispense: 90 tablet; Refill: 3  2. Colon cancer screening  - POC Hemoccult Bld/Stl (3-Cd Home Screen); Future  3. Need for vaccination for pneumococcus  - Pneumococcal polysaccharide vaccine 23-valent greater than or equal to 2yo subcutaneous/IM  4. COPD, mild ([facility])  Quit smoking, doing well  5. History of alcoholism ([place])  He quit drinking years ago

## 2019-12-28 ENCOUNTER — Ambulatory Visit (INDEPENDENT_AMBULATORY_CARE_PROVIDER_SITE_OTHER): Payer: Medicare HMO

## 2019-12-28 DIAGNOSIS — Z0000 Encounter for general adult medical examination without abnormal findings: Secondary | ICD-10-CM | POA: Diagnosis not present

## 2019-12-28 NOTE — Progress Notes (Signed)
Subjective:   Christian Griffith is a 64 y.o. male who presents for an Initial Medicare Annual Wellness Visit.  Virtual Visit via Telephone Note  I connected with [name] on 12/28/19 at 11:20 AM EDT by telephone and verified that I am speaking with the correct person using two identifiers.  Medicare Annual Wellness visit completed telephonically due to Covid-19 pandemic.   Location: Patient: home Provider: office   I discussed the limitations, risks, security and privacy concerns of performing an evaluation and management service by telephone and the availability of in person appointments. The patient expressed understanding and agreed to proceed.  Some vital signs may be absent or patient reported.   Clemetine Marker, LPN    Review of Systems   Cardiac Risk Factors include: advanced age (>51men, >19 women);male gender    Objective:    There were no vitals filed for this visit. There is no height or weight on file to calculate BMI.  Advanced Directives 12/28/2019 07/18/2017 01/21/2017 07/16/2016 01/08/2016 07/10/2015  Does Patient Have a Medical Advance Directive? Yes Yes Yes Yes Yes Yes  Type of Paramedic of [place];Living will - [place];Living will Living will [place];Living will [place];Living will  Does patient want to make changes to medical advance directive? - - - - No - Patient declined -  Copy of [place] in Chart? No - copy requested - No - copy requested - No - copy requested No - copy requested    Current Medications (verified) Outpatient Encounter Medications as of 12/28/2019  Medication Sig  . aspirin 81 MG tablet Take 81 mg by mouth daily.  . cholecalciferol (VITAMIN D) 1000 UNITS tablet Take by mouth.  Javier Docker Oil 300 MG CAPS Take by mouth.  . Thiamine Mononitrate (VITAMIN B1) 100 MG TABS Take by mouth 3 (three) times a week.   . verapamil  (CALAN-SR) 180 MG CR tablet TAKE ONE (1) TABLET EACH DAY   No facility-administered encounter medications on file as of 12/28/2019.    Allergies (verified) Patient has no known allergies.   History: Past Medical History:  Diagnosis Date  . Allergy   . Deficiency of vitamin B   . Dyslipidemia   . Encephalomyeloneuropathy   . History of alcohol abuse   . Hypertension   . Other emphysema ([place])   . Seizure disorder, primary ([place])   . Vitamin D deficiency   . Wernicke-Korsakoff syndrome (alcoholic) (HCC)    Past Surgical History:  Procedure Laterality Date  . VASECTOMY     Family History  Problem Relation Age of Onset  . Dementia Mother   . Hypertension Father    Social History   Socioeconomic History  . Marital status: Married    Spouse name: Mateo Flow  . Number of children: 2  . Years of education: Not on file  . Highest education level: High school graduate  Occupational History  . Occupation: Retired  Tobacco Use  . Smoking status: Former Smoker    Packs/day: 1.00    Years: 40.00    Pack years: 40.00    Types: Cigarettes    Start date: 07/10/1975    Quit date: 10/14/2018    Years since quitting: 1.2  . Smokeless tobacco: Never Used  Substance and Sexual Activity  . Alcohol use: No    Alcohol/week: 0.0 standard drinks    Comment: used to be a heavy drinker  . Drug use:  No  . Sexual activity: Yes    Partners: Female  Other Topics Concern  . Not on file  Social History Narrative   2 children and 1 granddaughter and 1 grandson    They have a house in [place] and one at [facility] area   Social Determinants of Health   Financial Resource Strain: [place]   . Difficulty of Paying Living Expenses: Not hard at all  Food Insecurity: No Food Insecurity  . Worried About Charity fundraiser in the Last Year: Never true  . Ran Out of Food in the Last Year: Never true  Transportation Needs: No Transportation Needs  . Lack of Transportation (Medical): No  . Lack  of Transportation (Non-Medical): No  Physical Activity: Sufficiently Active  . Days of Exercise per Week: 7 days  . Minutes of Exercise per Session: 30 min  Stress: No Stress Concern Present  . Feeling of Stress : Not at all  Social Connections: Slightly Isolated  . Frequency of Communication with Friends and Family: More than three times a week  . Frequency of Social Gatherings with Friends and Family: More than three times a week  . Attends Religious Services: Never  . Active Member of Clubs or Organizations: Yes  . Attends Archivist Meetings: More than 4 times per year  . Marital Status: Married   Tobacco Counseling Counseling given: Not Answered   Clinical Intake:  Pre-visit preparation completed: Yes  Pain : No/denies pain     Nutritional Risks: None Diabetes: No  How often do you need to have someone help you when you read instructions, pamphlets, or other written materials from your doctor or pharmacy?: 1 - Never  Interpreter Needed?: No  Information entered by :: Clemetine Marker LPN  Activities of Daily Living In your present state of health, do you have any difficulty performing the following activities: 12/28/2019 08/02/2019  Hearing? N N  Comment declines hearing aids -  Vision? N Y  Difficulty concentrating or making decisions? N N  Walking or climbing stairs? N N  Dressing or bathing? N N  Doing errands, shopping? N N  Preparing Food and eating ? N -  Using the Toilet? N -  In the past six months, have you accidently leaked urine? N -  Do you have problems with loss of bowel control? N -  Managing your Medications? N -  Managing your Finances? N -  Housekeeping or managing your Housekeeping? N -  Some recent data might be hidden     Immunizations and Health Maintenance Immunization History  Administered Date(s) Administered  . Pneumococcal Conjugate-13 07/18/2017  . Pneumococcal Polysaccharide-23 07/05/2014, 08/02/2019  . Tdap 07/05/2014     Health Maintenance Due  Topic Date Due  . COLONOSCOPY  Never done    Patient Care Team: Steele Sizer, MD as PCP - General (Family Medicine)  Indicate any recent Medical Services you may have received from other than [facility] providers in the past year (date may be approximate).    Assessment:   This is a routine wellness examination for Name.  Hearing/Vision screen  Hearing Screening   125Hz  250Hz  500Hz  1000Hz  2000Hz  3000Hz  4000Hz  6000Hz  8000Hz   Right ear:           Left ear:           Comments: Pt denies hearing difficulty   Vision Screening Comments: Annual vision screenings done at [facility] issues and exercise activities discussed: Current  Exercise Habits: Home exercise routine, Type of exercise: Other - see comments(yard work, playing with grandkids), Time (Minutes): 60, Frequency (Times/Week): 7, Weekly Exercise (Minutes/Week): 420, Intensity: Moderate, Exercise limited by: None identified  Goals   None    Depression Screen PHQ 2/9 Scores 12/28/2019 08/02/2019 08/17/2018 01/21/2017  PHQ - 2 Score 0 0 0 0  PHQ- 9 Score - 0 0 -    Fall Risk Fall Risk  12/28/2019 08/02/2019 08/17/2018 09/27/2017 01/21/2017  Falls in the past year? 0 0 0 No No  Number falls in past yr: 0 0 - - -  Injury with Fall? 0 0 - - -  Follow up Falls prevention discussed - - - -    FALL RISK PREVENTION PERTAINING TO THE HOME:  Any stairs in or around the home? Yes  If so, do they handrails? Yes   Home free of loose throw rugs in walkways, pet beds, electrical cords, etc? Yes  Adequate lighting in your home to reduce risk of falls? Yes   ASSISTIVE DEVICES UTILIZED TO PREVENT FALLS:  Life alert? No  Use of a cane, walker or w/c? No  Grab bars in the bathroom? Yes  Shower chair or bench in shower? No  Elevated toilet seat or a handicapped toilet? No   DME ORDERS:  DME order needed?  No   TIMED UP AND GO:  Was the test performed? No . Telephonic visit.    Education: Fall risk prevention has been discussed.  Intervention(s) required? No   Cognitive Function:     6CIT Screen 12/28/2019 07/18/2017  What Year? 0 points 4 points  What month? 0 points 0 points  What time? 0 points 0 points  Count back from 20 0 points 0 points  Months in reverse 0 points 0 points  Repeat phrase 4 points 6 points  Total Score 4 10    Screening Tests Health Maintenance  Topic Date Due  . COLONOSCOPY  Never done  . INFLUENZA VACCINE  07/25/2022 (Originally 05/15/2019)  . TETANUS/TDAP  07/05/2024  . Hepatitis C Screening  Completed  . PNA vac Low Risk Adult  Completed    Qualifies for Shingles Vaccine? Yes . Due for Shingrix. Education has been provided regarding the importance of this vaccine. Pt has been advised to call insurance company to determine out of pocket expense. Advised may also receive vaccine at local pharmacy or Health Dept. Verbalized acceptance and understanding.  Tdap: Up to date  Flu Vaccine: Due for Flu vaccine. Does the patient want to receive this vaccine today?  No . Education has been provided regarding the importance of this vaccine but still declined. Advised may receive this vaccine at local pharmacy or Health Dept. Aware to provide a copy of the vaccination record if obtained from local pharmacy or Health Dept. Verbalized acceptance and understanding.  Pneumococcal Vaccine: Up to date   Cancer Screenings:   Colorectal Screening: Not completed. Pt declines this screening.   Lung Cancer Screening: (Low Dose CT Chest recommended if Age 66-80 years, 30 pack-year currently smoking OR have quit w/in 15years.) does qualify. Pt declines.    Additional Screening:  Hepatitis C Screening: does qualify; Completed 07/05/14  Vision Screening: Recommended annual ophthalmology exams for early detection of glaucoma and other disorders of the eye. Is the patient up to date with their annual eye exam?  Yes  Who is the provider or what is  the name of the office in which the pt attends annual eye exams?  [facility]  Dental Screening: Recommended annual dental exams for proper oral hygiene  Community Resource Referral:  CRR required this visit?  No        Plan:    I have personally reviewed and addressed the Medicare Annual Wellness questionnaire and have noted the following in the patient's chart:  A. Medical and social history B. Use of alcohol, tobacco or illicit drugs  C. Current medications and supplements D. Functional ability and status E.  Nutritional status F.  Physical activity G. Advance directives H. List of other physicians I.  Hospitalizations, surgeries, and ER visits in previous 12 months J.  [place] such as hearing and vision if needed, cognitive and depression L. Referrals and appointments   In addition, I have reviewed and discussed with patient certain preventive protocols, quality metrics, and best practice recommendations. A written personalized care plan for preventive services as well as general preventive health recommendations were provided to patient.   Signed,  Clemetine Marker, LPN Nurse Health Advisor   Nurse Notes: pt advised due for office visit and labs

## 2019-12-28 NOTE — Patient Instructions (Signed)
Christian Griffith , Thank you for taking time to come for your Medicare Wellness Visit. I appreciate your ongoing commitment to your health goals. Please review the following plan we discussed and let me know if I can assist you in the future.   Screening recommendations/referrals: Colonoscopy: postponed Recommended yearly ophthalmology/optometry visit for glaucoma screening and checkup Recommended yearly dental visit for hygiene and checkup  Vaccinations: Influenza vaccine: postponed Pneumococcal vaccine: done 08/02/19 Tdap vaccine: done 07/05/14 Shingles vaccine: Shingrix discussed. Please contact your pharmacy for coverage information.   Advanced directives: Please bring a copy of your health care power of attorney and living will to the office at your convenience.  Conditions/risks identified: Keep up the great work!  Next appointment: Please follow up in one year for your Medicare Annual Wellness visit.    Preventive Care 66 Years and Older, Male Preventive care refers to lifestyle choices and visits with your health care provider that can promote health and wellness. What does preventive care include?  A yearly physical exam. This is also called an annual well check.  Dental exams once or twice a year.  Routine eye exams. Ask your health care provider how often you should have your eyes checked.  Personal lifestyle choices, including:  Daily care of your teeth and gums.  Regular physical activity.  Eating a healthy diet.  Avoiding tobacco and drug use.  Limiting alcohol use.  Practicing safe sex.  Taking low doses of aspirin every day.  Taking vitamin and mineral supplements as recommended by your health care provider. What happens during an annual well check? The services and screenings done by your health care provider during your annual well check will depend on your age, overall health, lifestyle risk factors, and family history of disease. Counseling  Your  health care provider may ask you questions about your:  Alcohol use.  Tobacco use.  Drug use.  Emotional well-being.  Home and relationship well-being.  Sexual activity.  Eating habits.  History of falls.  Memory and ability to understand (cognition).  Work and work Statistician. Screening  You may have the following tests or measurements:  Height, weight, and BMI.  Blood pressure.  Lipid and cholesterol levels. These may be checked every 5 years, or more frequently if you are over 66 years old.  Skin check.  Lung cancer screening. You may have this screening every year starting at age 66 if you have a 30-pack-year history of smoking and currently smoke or have quit within the past 15 years.  Fecal occult blood test (FOBT) of the stool. You may have this test every year starting at age 66.  Flexible sigmoidoscopy or colonoscopy. You may have a sigmoidoscopy every 5 years or a colonoscopy every 10 years starting at age 66.  Prostate cancer screening. Recommendations will vary depending on your family history and other risks.  Hepatitis C blood test.  Hepatitis B blood test.  Sexually transmitted disease (STD) testing.  Diabetes screening. This is done by checking your blood sugar (glucose) after you have not eaten for a while (fasting). You may have this done every 1-3 years.  Abdominal aortic aneurysm (AAA) screening. You may need this if you are a current or former smoker.  Osteoporosis. You may be screened starting at age 55 if you are at high risk. Talk with your health care provider about your test results, treatment options, and if necessary, the need for more tests. Vaccines  Your health care provider may recommend certain vaccines, such  as:  Influenza vaccine. This is recommended every year.  Tetanus, diphtheria, and acellular pertussis (Tdap, Td) vaccine. You may need a Td booster every 10 years.  Zoster vaccine. You may need this after age  66.  Pneumococcal 13-valent conjugate (PCV13) vaccine. One dose is recommended after age 66.  Pneumococcal polysaccharide (PPSV23) vaccine. One dose is recommended after age 84. Talk to your health care provider about which screenings and vaccines you need and how often you need them. This information is not intended to replace advice given to you by your health care provider. Make sure you discuss any questions you have with your health care provider. Document Released: 10/27/2015 Document Revised: 06/19/2016 Document Reviewed: 08/01/2015 Elsevier Interactive Patient Education  2017 [employer] Prevention in the Home Falls can cause injuries. They can happen to people of all ages. There are many things you can do to make your home safe and to help prevent falls. What can I do on the outside of my home?  Regularly fix the edges of walkways and driveways and fix any cracks.  Remove anything that might make you trip as you walk through a door, such as a raised step or threshold.  Trim any bushes or trees on the path to your home.  Use bright outdoor lighting.  Clear any walking paths of anything that might make someone trip, such as rocks or tools.  Regularly check to see if handrails are loose or broken. Make sure that both sides of any steps have handrails.  Any raised decks and porches should have guardrails on the edges.  Have any leaves, snow, or ice cleared regularly.  Use sand or salt on walking paths during winter.  Clean up any spills in your garage right away. This includes oil or grease spills. What can I do in the bathroom?  Use night lights.  Install grab bars by the toilet and in the tub and shower. Do not use towel bars as grab bars.  Use non-skid mats or decals in the tub or shower.  If you need to sit down in the shower, use a plastic, non-slip stool.  Keep the floor dry. Clean up any water that spills on the floor as soon as it happens.  Remove  soap buildup in the tub or shower regularly.  Attach bath mats securely with double-sided non-slip rug tape.  Do not have throw rugs and other things on the floor that can make you trip. What can I do in the bedroom?  Use night lights.  Make sure that you have a light by your bed that is easy to reach.  Do not use any sheets or blankets that are too big for your bed. They should not hang down onto the floor.  Have a firm chair that has side arms. You can use this for support while you get dressed.  Do not have throw rugs and other things on the floor that can make you trip. What can I do in the kitchen?  Clean up any spills right away.  Avoid walking on wet floors.  Keep items that you use a lot in easy-to-reach places.  If you need to reach something above you, use a strong step stool that has a grab bar.  Keep electrical cords out of the way.  Do not use floor polish or wax that makes floors slippery. If you must use wax, use non-skid floor wax.  Do not have throw rugs and other things on the  floor that can make you trip. What can I do with my stairs?  Do not leave any items on the stairs.  Make sure that there are handrails on both sides of the stairs and use them. Fix handrails that are broken or loose. Make sure that handrails are as long as the stairways.  Check any carpeting to make sure that it is firmly attached to the stairs. Fix any carpet that is loose or worn.  Avoid having throw rugs at the top or bottom of the stairs. If you do have throw rugs, attach them to the floor with carpet tape.  Make sure that you have a light switch at the top of the stairs and the bottom of the stairs. If you do not have them, ask someone to add them for you. What else can I do to help prevent falls?  Wear shoes that:  Do not have high heels.  Have rubber bottoms.  Are comfortable and fit you well.  Are closed at the toe. Do not wear sandals.  If you use a  stepladder:  Make sure that it is fully opened. Do not climb a closed stepladder.  Make sure that both sides of the stepladder are locked into place.  Ask someone to hold it for you, if possible.  Clearly mark and make sure that you can see:  Any grab bars or handrails.  First and last steps.  Where the edge of each step is.  Use tools that help you move around (mobility aids) if they are needed. These include:  Canes.  Walkers.  Scooters.  Crutches.  Turn on the lights when you go into a dark area. Replace any light bulbs as soon as they burn out.  Set up your furniture so you have a clear path. Avoid moving your furniture around.  If any of your floors are uneven, fix them.  If there are any pets around you, be aware of where they are.  Review your medicines with your doctor. Some medicines can make you feel dizzy. This can increase your chance of falling. Ask your doctor what other things that you can do to help prevent falls. This information is not intended to replace advice given to you by your health care provider. Make sure you discuss any questions you have with your health care provider. Document Released: 07/27/2009 Document Revised: 03/07/2016 Document Reviewed: 11/04/2014 Elsevier Interactive Patient Education  2017 [employer].

## 2020-04-28 ENCOUNTER — Telehealth: Payer: Self-pay | Admitting: Family Medicine

## 2020-04-28 NOTE — Chronic Care Management (AMB) (Signed)
  Chronic Care Management   Outreach Note  04/28/2020 Name: [name] MRN: [id number] DOB: 1955-11-22  Christian Griffith is a 64 y.o. year old male who is a primary care patient of Steele Sizer, MD. I reached out to [name] by phone today in response to a referral sent by Mr. Arvie Bartholomew Tiedeman's health plan.     An unsuccessful telephone outreach was attempted today. The patient was referred to the case management team for assistance with care management and care coordination.   Follow Up Plan: A HIPPA compliant phone message was left for the patient providing contact information and requesting a return call.  The care management team will reach out to the patient again over the next 7 days.  If patient returns call to provider office, please advise to call [facility]  at [phone].  Noreene Larsson, [address] [place], [place], [place] [address] Direct Dial: [phone] [email or web address]: [email or web address]

## 2020-05-10 NOTE — Chronic Care Management (AMB) (Signed)
  Chronic Care Management   Outreach Note  05/10/2020 Name: Christian Griffith MRN: [id number] DOB: 1956-05-02  Christian Griffith is a 64 y.o. year old male who is a primary care patient of Steele Sizer, MD. I reached out to [name] by phone today in response to a referral sent by [name]'s health plan.     A second unsuccessful telephone outreach was attempted today. The patient was referred to the case management team for assistance with care management and care coordination.   Follow Up Plan: A HIPPA compliant phone message was left for the patient providing contact information and requesting a return call.  The care management team will reach out to the patient again over the next 7 days.  If patient returns call to provider office, please advise to call [name]  at [place], [name], [name],  [address] Direct Dial: [phone] [name] Website: [email or web address]

## 2020-05-15 NOTE — Chronic Care Management (AMB) (Signed)
  Chronic Care Management   Note  05/15/2020 Name: Christian Griffith MRN: [id number] DOB: [birth year]  Christian Griffith is a 64 y.o. year old male who is a primary care patient of Steele Sizer, MD. I reached out to [name] by phone today in response to a referral sent by [name]'s health plan.     [name] was given information about Chronic Care Management services today including:  1. CCM service includes personalized support from designated clinical staff supervised by his physician, including individualized plan of care and coordination with other care providers 2. 24/7 contact phone numbers for assistance for urgent and routine care needs. 3. Service will only be billed when office clinical staff spend 20 minutes or more in a month to coordinate care. 4. Only one practitioner may furnish and bill the service in a calendar month. 5. The patient may stop CCM services at any time (effective at the end of the month) by phone call to the office staff. 6. The patient will be responsible for cost sharing (co-pay) of up to 20% of the service fee (after annual deductible is met).  Patient did not agree to enrollment in care management services and does not wish to consider at this time.  Follow up plan: The care management team is available to follow up with the patient after provider conversation with the patient regarding recommendation for care management engagement and subsequent re-referral to the care management team.   Noreene Larsson, [place], [place], [place] [address] Direct Dial: [phone] [email or web address]: [email or web address]

## 2020-07-27 ENCOUNTER — Other Ambulatory Visit: Payer: Self-pay | Admitting: Family Medicine

## 2020-07-27 DIAGNOSIS — Z1211 Encounter for screening for malignant neoplasm of colon: Secondary | ICD-10-CM

## 2020-08-14 LAB — FECAL OCCULT BLOOD, IMMUNOCHEMICAL: IFOBT: NEGATIVE

## 2020-08-30 ENCOUNTER — Ambulatory Visit (INDEPENDENT_AMBULATORY_CARE_PROVIDER_SITE_OTHER): Payer: Medicare HMO | Admitting: Family Medicine

## 2020-08-30 ENCOUNTER — Other Ambulatory Visit: Payer: Self-pay

## 2020-08-30 ENCOUNTER — Encounter: Payer: Self-pay | Admitting: Family Medicine

## 2020-08-30 VITALS — BP 128/72 | HR 83 | Temp 98.0°F | Resp 18 | Ht 63.0 in | Wt 159.3 lb

## 2020-08-30 DIAGNOSIS — E559 Vitamin D deficiency, unspecified: Secondary | ICD-10-CM | POA: Diagnosis not present

## 2020-08-30 DIAGNOSIS — E785 Hyperlipidemia, unspecified: Secondary | ICD-10-CM

## 2020-08-30 DIAGNOSIS — E538 Deficiency of other specified B group vitamins: Secondary | ICD-10-CM | POA: Diagnosis not present

## 2020-08-30 DIAGNOSIS — J449 Chronic obstructive pulmonary disease, unspecified: Secondary | ICD-10-CM

## 2020-08-30 DIAGNOSIS — R69 Illness, unspecified: Secondary | ICD-10-CM | POA: Diagnosis not present

## 2020-08-30 DIAGNOSIS — E539 Vitamin B deficiency, unspecified: Secondary | ICD-10-CM | POA: Diagnosis not present

## 2020-08-30 DIAGNOSIS — J302 Other seasonal allergic rhinitis: Secondary | ICD-10-CM

## 2020-08-30 DIAGNOSIS — I10 Essential (primary) hypertension: Secondary | ICD-10-CM

## 2020-08-30 DIAGNOSIS — F1021 Alcohol dependence, in remission: Secondary | ICD-10-CM

## 2020-08-30 MED ORDER — FLUTICASONE PROPIONATE 50 MCG/ACT NA SUSP: 2.0000 | Freq: Every day | NASAL | 1 refills | Status: AC

## 2020-08-30 MED ORDER — VERAPAMIL HCL ER 180 MG PO TBCR: EXTENDED_RELEASE_TABLET | ORAL | 3 refills | Status: DC

## 2020-08-30 NOTE — Progress Notes (Signed)
Name: Christian Griffith   MRN: [id number]    DOB: 06-02-1956   Date:08/30/2020       Progress Note  Subjective  Chief Complaint  Medication refill  HPI   HTN: taking medication, no side effects, denies constipation or swelling, no palpitation.BP is at goal.   Hyperlipidemia: he refused medication in the past, only on otc Krill oil, we will recheck labs today , he usually eats a balanced diet.   COPD: he quit smoking back in  January 2020, he is very active, likes working outside, still fishing . Denies sob but still has an occasional cough. No wheezing, great exercise tolerance   History of alcoholism: that caused him to be admitted with encephalopathy back in 2014 , doing well since he quit drinking. Feeling better, memory is good now, he is taking vitamin super B complex three times a week  . He states he does not crave alcohol He denies tremors or confusion   Vitamin D def : he is taking otc vitamin D , we will recheck level today   AR: he takes otc loratading, he has rhinorrhea but no congestion.   Patient Active Problem List   Diagnosis Date Noted  . Dyslipidemia 07/07/2015  . History of tobacco abuse 07/07/2015  . Deficiency of vitamin B 07/07/2015  . Vitamin D deficiency 07/07/2015  . History of alcoholism ([place]) 07/07/2015  . Hay fever 04/12/2009  . Benign essential HTN 04/12/2009  . COPD, mild ([place]) 04/12/2009    Past Surgical History:  Procedure Laterality Date  . VASECTOMY      Family History  Problem Relation Age of Onset  . Dementia Mother   . Hypertension Father     Social History   Tobacco Use  . Smoking status: Former Smoker    Packs/day: 1.00    Years: 40.00    Pack years: 40.00    Types: Cigarettes    Start date: 07/10/1975    Quit date: 10/14/2018    Years since quitting: 1.8  . Smokeless tobacco: Never Used  Substance Use Topics  . Alcohol use: No    Alcohol/week: 0.0 standard drinks    Comment: used to be a heavy drinker     Current  Outpatient Medications:  .  aspirin 81 MG tablet, Take 81 mg by mouth daily., Disp: , Rfl:  .  cholecalciferol (VITAMIN D) 1000 UNITS tablet, Take by mouth., Disp: , Rfl:  .  Krill Oil 300 MG CAPS, Take by mouth., Disp: , Rfl:  .  Thiamine Mononitrate (VITAMIN B1) 100 MG TABS, Take by mouth 3 (three) times a week. , Disp: , Rfl:  .  verapamil (CALAN-SR) 180 MG CR tablet, TAKE ONE (1) TABLET EACH DAY, Disp: 90 tablet, Rfl: 3  No Known Allergies  I personally reviewed active problem list, medication list, allergies, family history, social history, health maintenance with the patient/caregiver today.   ROS  Constitutional: Negative for fever or weight change.  Respiratory: Negative for cough and shortness of breath.   Cardiovascular: Negative for chest pain or palpitations.  Gastrointestinal: Negative for abdominal pain, no bowel changes.  Musculoskeletal: Negative for gait problem or joint swelling.  Skin: Negative for rash.  Neurological: Negative for dizziness or headache.  No other specific complaints in a complete review of systems (except as listed in HPI above).  Objective  Vitals:   08/30/20 1108  BP: 128/72  Pulse: 83  Resp: 18  Temp: 98 F (36.7 C)  TempSrc: Oral  SpO2: 99%  Weight: 159 lb 4.8 oz (72.3 kg)  Height: 5\' 3"  (1.6 m)    Body mass index is 28.22 kg/m.  Physical Exam  Constitutional: Patient appears well-developed and well-nourished.  No distress.  HEENT: head atraumatic, normocephalic, pupils equal and reactive to light, neck supple Cardiovascular: Normal rate, regular rhythm and normal heart sounds.  No murmur heard. No BLE edema. Pulmonary/Chest: Effort normal and breath sounds normal. No respiratory distress. Abdominal: Soft.  There is no tenderness. Psychiatric: Patient has a normal mood and affect. behavior is normal. Judgment and thought content normal.  Recent Results (from the past 2160 hour(s))  Fecal occult blood, imunochemical      Status: None   Collection Time: 08/14/20 12:00 AM  Result Value Ref Range   IFOBT Negative     PHQ2/9: Depression screen [facility] 2/9 08/30/2020 12/28/2019 08/02/2019 08/17/2018 01/21/2017  Decreased Interest 0 0 0 0 0  Down, Depressed, Hopeless 0 0 0 0 0  PHQ - 2 Score 0 0 0 0 0  Altered sleeping - - 0 0 -  Tired, decreased energy - - 0 0 -  Change in appetite - - 0 0 -  Feeling bad or failure about yourself  - - 0 0 -  Trouble concentrating - - 0 0 -  Moving slowly or fidgety/restless - - 0 0 -  Suicidal thoughts - - 0 0 -  PHQ-9 Score - - 0 0 -  Difficult doing work/chores - - Not difficult at all Not difficult at all -    phq 9 is negative   Fall Risk: Fall Risk  08/30/2020 12/28/2019 08/02/2019 08/17/2018 09/27/2017  Falls in the past year? 0 0 0 0 No  Number falls in past yr: 0 0 0 - -  Injury with Fall? 0 0 0 - -  Follow up - Falls prevention discussed - - -     Functional Status Survey: Is the patient deaf or have difficulty hearing?: No Does the patient have difficulty seeing, even when wearing glasses/contacts?: No Does the patient have difficulty concentrating, remembering, or making decisions?: No Does the patient have difficulty walking or climbing stairs?: No Does the patient have difficulty dressing or bathing?: No Does the patient have difficulty doing errands alone such as visiting a doctor's office or shopping?: No   Assessment & Plan  1. COPD, mild ([place])  Doing well   2. Benign essential HTN  - CBC with Differential/Platelet - COMPLETE METABOLIC PANEL WITH GFR - verapamil (CALAN-SR) 180 MG CR tablet; TAKE ONE (1) TABLET EACH DAY  Dispense: 90 tablet; Refill: 3  3. History of alcoholism ([place])   4. Vitamin D deficiency  - VITAMIN D 25 Hydroxy (Vit-D Deficiency, Fractures)  5. Deficiency of vitamin B  - B12 and Folate Panel - CBC with Differential/Platelet - Vitamin B1  6. Dyslipidemia  - Lipid panel  7. Seasonal allergic rhinitis,  unspecified trigger  - fluticasone (FLONASE) 50 MCG/ACT nasal spray; Place 2 sprays into both nostrils daily.  Dispense: 48 g; Refill: 1

## 2020-09-02 LAB — B12 AND FOLATE PANEL
Folate: 15 ng/mL
Vitamin B-12: 274 pg/mL (ref 200–1100)

## 2020-09-02 LAB — CBC WITH DIFFERENTIAL/PLATELET
Absolute Monocytes: 782 cells/uL (ref 200–950)
Basophils Absolute: 69 cells/uL (ref 0–200)
Basophils Relative: 0.7 %
Eosinophils Absolute: 317 cells/uL (ref 15–500)
Eosinophils Relative: 3.2 %
HCT: 45.4 % (ref 38.5–50.0)
Hemoglobin: 15.5 g/dL (ref 13.2–17.1)
Lymphs Abs: 2376 cells/uL (ref 850–3900)
MCH: 29.8 pg (ref 27.0–33.0)
MCHC: 34.1 g/dL (ref 32.0–36.0)
MCV: 87.1 fL (ref 80.0–100.0)
MPV: 11.3 fL (ref 7.5–12.5)
Monocytes Relative: 7.9 %
Neutro Abs: 6356 cells/uL (ref 1500–7800)
Neutrophils Relative %: 64.2 %
Platelets: 182 10*3/uL (ref 140–400)
RBC: 5.21 10*6/uL (ref 4.20–5.80)
RDW: 13 % (ref 11.0–15.0)
Total Lymphocyte: 24 %
WBC: 9.9 10*3/uL (ref 3.8–10.8)

## 2020-09-02 LAB — COMPLETE METABOLIC PANEL WITH GFR
AG Ratio: 2 (calc) (ref 1.0–2.5)
ALT: 15 U/L (ref 9–46)
AST: 17 U/L (ref 10–35)
Albumin: 4.5 g/dL (ref 3.6–5.1)
Alkaline phosphatase (APISO): 70 U/L (ref 35–144)
BUN: 11 mg/dL (ref 7–25)
CO2: 31 mmol/L (ref 20–32)
Calcium: 9.8 mg/dL (ref 8.6–10.3)
Chloride: 105 mmol/L (ref 98–110)
Creat: 0.79 mg/dL (ref 0.70–1.25)
GFR, Est African American: 108 mL/min/{1.73_m2} (ref >=60)
GFR, Est Non African American: 94 mL/min/{1.73_m2} (ref >=60)
Globulin: 2.2 g/dL (calc) (ref 1.9–3.7)
Glucose, Bld: 96 mg/dL (ref 65–99)
Potassium: 5.4 mmol/L — ABNORMAL HIGH (ref 3.5–5.3)
Sodium: 142 mmol/L (ref 135–146)
Total Bilirubin: 0.6 mg/dL (ref 0.2–1.2)
Total Protein: 6.7 g/dL (ref 6.1–8.1)

## 2020-09-02 LAB — LIPID PANEL
Cholesterol: 206 mg/dL — ABNORMAL HIGH (ref <200)
HDL: 46 mg/dL (ref >=40)
LDL Cholesterol (Calc): 134 mg/dL (calc) — ABNORMAL HIGH
Non-HDL Cholesterol (Calc): 160 mg/dL (calc) — ABNORMAL HIGH (ref <130)
Total CHOL/HDL Ratio: 4.5 (calc) (ref <5.0)
Triglycerides: 136 mg/dL (ref <150)

## 2020-09-02 LAB — VITAMIN B1: Vitamin B1 (Thiamine): 36 nmol/L — ABNORMAL HIGH (ref 8–30)

## 2020-09-02 LAB — VITAMIN D 25 HYDROXY (VIT D DEFICIENCY, FRACTURES): Vit D, 25-Hydroxy: 29 ng/mL — ABNORMAL LOW (ref 30–100)

## 2020-11-10 ENCOUNTER — Telehealth: Payer: Self-pay

## 2020-11-10 ENCOUNTER — Encounter: Payer: Self-pay | Admitting: Family Medicine

## 2020-11-10 DIAGNOSIS — Z20822 Contact with and (suspected) exposure to covid-19: Secondary | ICD-10-CM | POA: Diagnosis not present

## 2020-11-10 DIAGNOSIS — E538 Deficiency of other specified B group vitamins: Secondary | ICD-10-CM | POA: Insufficient documentation

## 2020-11-10 DIAGNOSIS — U071 COVID-19: Secondary | ICD-10-CM | POA: Diagnosis not present

## 2020-11-10 NOTE — Telephone Encounter (Signed)
  Copied from [place] [phone]. Topic: General - Other >> Nov 10, 2020  8:05 AM Leward Quan A wrote: Reason for CRM: Patient wife called in to inform Dr Ancil Boozer that patient has a rash on his back, c/o tiredness has tightness in his back and shoulders and needed to be seen. Please call Ph#  [phone]   Spoke with wife and pt is currently at urgent care

## 2020-11-13 ENCOUNTER — Telehealth (INDEPENDENT_AMBULATORY_CARE_PROVIDER_SITE_OTHER): Payer: Medicare HMO | Admitting: Family Medicine

## 2020-11-13 ENCOUNTER — Encounter: Payer: Self-pay | Admitting: Family Medicine

## 2020-11-13 ENCOUNTER — Other Ambulatory Visit: Payer: Self-pay

## 2020-11-13 VITALS — Temp 97.6°F | Wt 159.0 lb

## 2020-11-13 DIAGNOSIS — R21 Rash and other nonspecific skin eruption: Secondary | ICD-10-CM

## 2020-11-13 DIAGNOSIS — U071 COVID-19: Secondary | ICD-10-CM

## 2020-11-13 MED ORDER — HYDROXYZINE HCL 10 MG PO TABS: 10.0000 mg | ORAL_TABLET | Freq: Three times a day (TID) | ORAL | 0 refills | Status: DC | PRN

## 2020-11-13 NOTE — Progress Notes (Signed)
Name: Christian Griffith   MRN: [id number]    DOB: 08/04/1956   Date:11/13/2020       Progress Note  Subjective  Chief Complaint  Chief Complaint  Patient presents with  . Covid Positive    I connected with  Parley Pidcock Espinal  on 11/13/20 at 11:40 AM EST by a video enabled telemedicine application and verified that I am speaking with the correct person using two identifiers.  I discussed the limitations of evaluation and management by telemedicine and the availability of in person appointments. The patient expressed understanding and agreed to proceed with the virtual visit  Staff also discussed with the patient that there may be a patient responsible charge related to this service. Patient Location: at home  Provider Location: [facility] [place] Additional Individuals present: wife   HPI  COVID-19: developed symptoms 6 days ago , initially only fatigue and mild diarrhea. However two days later had a rash on his back. He went to Urgent Care and was diagnosed with rapid COVID-19 test on Friday 11/10/2020 . He is feeling better , ready to go outside , but rash is still present on his back and very itchy. Old rx of steroid helped.  Denies sob, increase in cough or wheezing  Patient Active Problem List   Diagnosis Date Noted  . B12 deficiency 11/10/2020  . Dyslipidemia 07/07/2015  . History of tobacco abuse 07/07/2015  . Deficiency of vitamin B 07/07/2015  . Vitamin D deficiency 07/07/2015  . History of alcoholism ([place]) 07/07/2015  . Hay fever 04/12/2009  . Benign essential HTN 04/12/2009  . COPD, mild ([place]) 04/12/2009    Past Surgical History:  Procedure Laterality Date  . VASECTOMY      Family History  Problem Relation Age of Onset  . Dementia Mother   . Hypertension Father     Social History   Socioeconomic History  . Marital status: Married    Spouse name: Mateo Flow  . Number of children: 2  . Years of education: Not on file  . Highest education level: High school graduate   Occupational History  . Occupation: Retired  Tobacco Use  . Smoking status: Former Smoker    Packs/day: 1.00    Years: 40.00    Pack years: 40.00    Types: Cigarettes    Start date: 07/10/1975    Quit date: 10/14/2018    Years since quitting: 2.0  . Smokeless tobacco: Never Used  Vaping Use  . Vaping Use: Former  . Start date: 05/18/2014  . Quit date: 07/18/2014  Substance and Sexual Activity  . Alcohol use: No    Alcohol/week: 0.0 standard drinks    Comment: used to be a heavy drinker  . Drug use: No  . Sexual activity: Yes    Partners: Female  Other Topics Concern  . Not on file  Social History Narrative   2 children and 1 granddaughter and 1 grandson    They have a house in [place] and one at [facility] area   Social Determinants of Health   Financial Resource Strain: [place]   . Difficulty of Paying Living Expenses: Not hard at all  Food Insecurity: No Food Insecurity  . Worried About Charity fundraiser in the Last Year: Never true  . Ran Out of Food in the Last Year: Never true  Transportation Needs: No Transportation Needs  . Lack of Transportation (Medical): No  . Lack of Transportation (Non-Medical): No  Physical Activity: Sufficiently Active  .  Days of Exercise per Week: 7 days  . Minutes of Exercise per Session: 30 min  Stress: No Stress Concern Present  . Feeling of Stress : Not at all  Social Connections: Moderately Integrated  . Frequency of Communication with Friends and Family: More than three times a week  . Frequency of Social Gatherings with Friends and Family: More than three times a week  . Attends Religious Services: Never  . Active Member of Clubs or Organizations: Yes  . Attends Archivist Meetings: More than 4 times per year  . Marital Status: Married  Human resources officer Violence: Not At Risk  . Fear of Current or Ex-Partner: No  . Emotionally Abused: No  . Physically Abused: No  . Sexually Abused: No     Current Outpatient  Medications:  .  aspirin 81 MG tablet, Take 81 mg by mouth daily., Disp: , Rfl:  .  cholecalciferol (VITAMIN D) 1000 UNITS tablet, Take by mouth., Disp: , Rfl:  .  fluticasone (FLONASE) 50 MCG/ACT nasal spray, Place 2 sprays into both nostrils daily., Disp: 48 g, Rfl: 1 .  hydrOXYzine (ATARAX/VISTARIL) 10 MG tablet, Take 1 tablet (10 mg total) by mouth 3 (three) times daily as needed., Disp: 30 tablet, Rfl: 0 .  Krill Oil 300 MG CAPS, Take by mouth., Disp: , Rfl:  .  Thiamine Mononitrate (VITAMIN B1) 100 MG TABS, Take by mouth 3 (three) times a week. , Disp: , Rfl:  .  verapamil (CALAN-SR) 180 MG CR tablet, TAKE ONE (1) TABLET EACH DAY, Disp: 90 tablet, Rfl: 3  No Known Allergies  I personally reviewed active problem list, medication list, allergies, family history, social history with the patient/caregiver today.   ROS  Ten systems reviewed and is negative except as mentioned in HPI   Objective  Virtual encounter, vitals not obtained.  Body mass index is 28.17 kg/m.  Physical Exam  Awake, alert and oriented, they tried to show me the rash on his back, but quality of image was very poor  PHQ2/9: Depression screen [facility] 2/9 11/13/2020 08/30/2020 12/28/2019 08/02/2019 08/17/2018  Decreased Interest 0 0 0 0 0  Down, Depressed, Hopeless 0 0 0 0 0  PHQ - 2 Score 0 0 0 0 0  Altered sleeping - - - 0 0  Tired, decreased energy - - - 0 0  Change in appetite - - - 0 0  Feeling bad or failure about yourself  - - - 0 0  Trouble concentrating - - - 0 0  Moving slowly or fidgety/restless - - - 0 0  Suicidal thoughts - - - 0 0  PHQ-9 Score - - - 0 0  Difficult doing work/chores - - - Not difficult at all Not difficult at all   PHQ-2/9 Result is negative.    Fall Risk: Fall Risk  11/13/2020 08/30/2020 12/28/2019 08/02/2019 08/17/2018  Falls in the past year? 0 0 0 0 0  Number falls in past yr: 0 0 0 0 -  Injury with Fall? 0 0 0 0 -  Follow up - - Falls prevention discussed - -      Assessment & Plan  1. COVID-19  Doing better  2. Rash of back  - hydrOXYzine (ATARAX/VISTARIL) 10 MG tablet; Take 1 tablet (10 mg total) by mouth 3 (three) times daily as needed.  Dispense: 30 tablet; Refill: 0  I discussed the assessment and treatment plan with the patient. The patient was provided an opportunity to ask  questions and all were answered. The patient agreed with the plan and demonstrated an understanding of the instructions.  The patient was advised to call back or seek an in-person evaluation if the symptoms worsen or if the condition fails to improve as anticipated.  I provided 15 minutes of non-face-to-face time during this encounter.

## 2020-11-13 NOTE — Patient Instructions (Signed)

## 2020-12-22 NOTE — Progress Notes (Signed)
Name: Christian Griffith   MRN: [id number]    DOB: Aug 06, 1956   Date:12/26/2020       Progress Note  Subjective  Chief Complaint  Follow Up  HPI  HTN: taking medication, no side effects, denies constipation or swelling, no palpitation.BP is at goal. he is compliant with medication   Hyperlipidemia: he refused medication in the past, only on otc Krill oil, last LDL was 134 , HDL 46, triglycerides 136. He continues to refused medication  The 10-year ASCVD risk score Christian Griffith) is: 19%   Values used to calculate the score:     Age: 65 years     Sex: Male     Is Non-Hispanic African American: No     Diabetic: No     Tobacco smoker: No     Systolic Blood Pressure: 938 mmHg     Is BP treated: Yes     HDL Cholesterol: 46 mg/dL     Total Cholesterol: 206 mg/dL  COPD: he quit smoking back in  January 2020, he is very active, likes working outside, still fishing . Denies sob but still has an occasional cough. No wheezing, he has good exercise tolerance.   History of alcoholism: that caused him to be admitted with encephalopathyback in 2014, doing well since he quit drinking. Feeling better, memory is good now, he is taking vitamin super B complex three times a week, no longer taking B12 or B1 , last B1 was slightly high but B12 was low, advised to take B1 once a week and B12 three times a week and recheck next visit   Vitamin D def : he is taking otc vitamin D , last level slightly low, but 29, continue supplementation   AR: he takes otc loratadin, he has chronic rhinorrhea - usually in am's  but no congestion.    Patient Active Problem List   Diagnosis Date Noted  . B12 deficiency 11/10/2020  . Dyslipidemia 07/07/2015  . History of tobacco abuse 07/07/2015  . Deficiency of vitamin B 07/07/2015  . Vitamin D deficiency 07/07/2015  . History of alcoholism (Christian Griffith) 07/07/2015  . Hay fever 04/12/2009  . Benign essential HTN 04/12/2009  . COPD, mild (Christian Griffith) 04/12/2009     Past Surgical History:  Procedure Laterality Date  . VASECTOMY      Family History  Problem Relation Age of Onset  . Dementia Mother   . Hypertension Father     Social History   Tobacco Use  . Smoking status: Former Smoker    Packs/day: 1.00    Years: 40.00    Pack years: 40.00    Types: Cigarettes    Start date: 07/10/1975    Quit date: 10/14/2018    Years since quitting: 2.2  . Smokeless tobacco: Never Used  Substance Use Topics  . Alcohol use: No    Alcohol/week: 0.0 standard drinks    Comment: used to be a heavy drinker     Current Outpatient Medications:  .  aspirin 81 MG tablet, Take 81 mg by mouth daily., Disp: , Rfl:  .  cholecalciferol (VITAMIN D) 1000 UNITS tablet, Take by mouth., Disp: , Rfl:  .  fluticasone (FLONASE) 50 MCG/ACT nasal spray, Place 2 sprays into both nostrils daily., Disp: 48 g, Rfl: 1 .  hydrOXYzine (ATARAX/VISTARIL) 10 MG tablet, Take 1 tablet (10 mg total) by mouth 3 (three) times daily as needed., Disp: 30 tablet, Rfl: 0 .  Krill Oil 300 MG CAPS,  Take by mouth., Disp: , Rfl:  .  Thiamine Mononitrate (VITAMIN B1) 100 MG TABS, Take by mouth 3 (three) times a week. , Disp: , Rfl:  .  verapamil (CALAN-SR) 180 MG CR tablet, TAKE ONE (1) TABLET EACH DAY, Disp: 90 tablet, Rfl: 3  No Known Allergies  I personally reviewed active problem list, medication list, allergies, family history, social history, health maintenance with the patient/caregiver today.   ROS  Constitutional: Negative for fever , positive for weight change.  Respiratory: Negative for cough and shortness of breath.   Cardiovascular: Negative for chest pain or palpitations.  Gastrointestinal: Negative for abdominal pain, no bowel changes.  Musculoskeletal: Negative for gait problem or joint swelling.  Skin: Negative for rash.  Neurological: Negative for dizziness or headache.  No other specific complaints in a complete review of systems (except as listed in HPI  above).  Objective  Vitals:   12/26/20 1003  BP: 132/76  Pulse: 66  Resp: 16  Temp: 97.6 F (36.4 C)  TempSrc: Oral  SpO2: 98%  Weight: 155 lb (70.3 kg)  Height: 5\' 3"  (1.6 m)    Body mass index is 27.46 kg/m.  Physical Exam  Constitutional: Patient appears well-developed and well-nourished. Overweight No distress.  HEENT: head atraumatic, normocephalic, pupils equal and reactive to light,neck supple Cardiovascular: Normal rate, regular rhythm and normal heart sounds.  No murmur heard. No BLE edema. Pulmonary/Chest: Effort normal and breath sounds normal. No respiratory distress. Abdominal: Soft.  There is no tenderness. Psychiatric: Patient has a normal mood and affect. behavior is normal. Judgment and thought content normal.  PHQ2/9: Depression screen Christian Griffith 2/9 12/26/2020 11/13/2020 08/30/2020 12/28/2019 08/02/2019  Decreased Interest 0 0 0 0 0  Down, Depressed, Hopeless 0 0 0 0 0  PHQ - 2 Score 0 0 0 0 0  Altered sleeping - - - - 0  Tired, decreased energy - - - - 0  Change in appetite - - - - 0  Feeling bad or failure about yourself  - - - - 0  Trouble concentrating - - - - 0  Moving slowly or fidgety/restless - - - - 0  Suicidal thoughts - - - - 0  PHQ-9 Score - - - - 0  Difficult doing work/chores - - - - Not difficult at all    phq 9 is negative   Fall Risk: Fall Risk  12/26/2020 11/13/2020 08/30/2020 12/28/2019 08/02/2019  Falls in the past year? 0 0 0 0 0  Number falls in past yr: 0 0 0 0 0  Injury with Fall? 0 0 0 0 0  Follow up - - - Falls prevention discussed -     Functional Status Survey: Is the patient deaf or have difficulty hearing?: No Does the patient have difficulty seeing, even when wearing glasses/contacts?: No Does the patient have difficulty concentrating, remembering, or making decisions?: No Does the patient have difficulty walking or climbing stairs?: No Does the patient have difficulty dressing or bathing?: No Does the patient have  difficulty doing errands alone such as visiting a doctor's office or shopping?: No    Assessment & Plan  1. COPD, mild (Christian Griffith)  Doing well   2. Benign essential HTN  bp is at goal   3. History of alcoholism (Christian Griffith)  Doing well   4. Vitamin D deficiency  Continue supplementation  5. Deficiency of vitamin B  - Cyanocobalamin (B-12) 1000 MCG SUBL; Place 1 tablet under the tongue daily.  Dispense: 30  tablet; Refill: 0  6. Dyslipidemia  Refuses medication   7. Rhinorrhea  - ipratropium (ATROVENT) 0.06 % nasal spray; Place 2 sprays into both nostrils 4 (four) times daily.  Dispense: 15 mL; Refill: 2.

## 2020-12-26 ENCOUNTER — Other Ambulatory Visit: Payer: Self-pay

## 2020-12-26 ENCOUNTER — Encounter: Payer: Self-pay | Admitting: Family Medicine

## 2020-12-26 ENCOUNTER — Ambulatory Visit (INDEPENDENT_AMBULATORY_CARE_PROVIDER_SITE_OTHER): Payer: Medicare HMO | Admitting: Family Medicine

## 2020-12-26 VITALS — BP 132/76 | HR 66 | Temp 97.6°F | Resp 16 | Ht 63.0 in | Wt 155.0 lb

## 2020-12-26 DIAGNOSIS — I10 Essential (primary) hypertension: Secondary | ICD-10-CM | POA: Diagnosis not present

## 2020-12-26 DIAGNOSIS — E559 Vitamin D deficiency, unspecified: Secondary | ICD-10-CM

## 2020-12-26 DIAGNOSIS — E785 Hyperlipidemia, unspecified: Secondary | ICD-10-CM

## 2020-12-26 DIAGNOSIS — J3489 Other specified disorders of nose and nasal sinuses: Secondary | ICD-10-CM | POA: Diagnosis not present

## 2020-12-26 DIAGNOSIS — F1021 Alcohol dependence, in remission: Secondary | ICD-10-CM | POA: Diagnosis not present

## 2020-12-26 DIAGNOSIS — R69 Illness, unspecified: Secondary | ICD-10-CM | POA: Diagnosis not present

## 2020-12-26 DIAGNOSIS — E539 Vitamin B deficiency, unspecified: Secondary | ICD-10-CM

## 2020-12-26 DIAGNOSIS — J449 Chronic obstructive pulmonary disease, unspecified: Secondary | ICD-10-CM

## 2020-12-26 MED ORDER — B-12 1000 MCG SL SUBL: 1.0000 | SUBLINGUAL_TABLET | Freq: Every day | SUBLINGUAL | 0 refills | Status: AC

## 2020-12-26 MED ORDER — IPRATROPIUM BROMIDE 0.06 % NA SOLN: 2.0000 | Freq: Four times a day (QID) | NASAL | 2 refills | Status: AC

## 2021-01-02 ENCOUNTER — Ambulatory Visit (INDEPENDENT_AMBULATORY_CARE_PROVIDER_SITE_OTHER): Payer: Medicare HMO

## 2021-01-02 DIAGNOSIS — Z0000 Encounter for general adult medical examination without abnormal findings: Secondary | ICD-10-CM | POA: Diagnosis not present

## 2021-01-02 NOTE — Patient Instructions (Signed)
Mr. Christian Griffith , Thank you for taking time to come for your Medicare Wellness Visit. I appreciate your ongoing commitment to your health goals. Please review the following plan we discussed and let me know if I can assist you in the future.   Screening recommendations/referrals: Colonoscopy: FOBT done 08/14/20 Recommended yearly ophthalmology/optometry visit for glaucoma screening and checkup Recommended yearly dental visit for hygiene and checkup  Vaccinations: Influenza vaccine: declined Pneumococcal vaccine: done 08/02/19 Tdap vaccine: done 07/05/14 Shingles vaccine: Shingrix discussed. Please contact your pharmacy for coverage information.  Covid-19:  declined  Advanced directives: Please bring a copy of your health care power of attorney and living will to the office at your convenience.  Conditions/risks identified: Keep up the great work!  Next appointment: Follow up in one year for your annual wellness visit.   Preventive Care 67 Years and Older, Male Preventive care refers to lifestyle choices and visits with your health care provider that can promote health and wellness. What does preventive care include?  A yearly physical exam. This is also called an annual well check.  Dental exams once or twice a year.  Routine eye exams. Ask your health care provider how often you should have your eyes checked.  Personal lifestyle choices, including:  Daily care of your teeth and gums.  Regular physical activity.  Eating a healthy diet.  Avoiding tobacco and drug use.  Limiting alcohol use.  Practicing safe sex.  Taking low doses of aspirin every day.  Taking vitamin and mineral supplements as recommended by your health care provider. What happens during an annual well check? The services and screenings done by your health care provider during your annual well check will depend on your age, overall health, lifestyle risk factors, and family history of disease. Counseling   Your health care provider may ask you questions about your:  Alcohol use.  Tobacco use.  Drug use.  Emotional well-being.  Home and relationship well-being.  Sexual activity.  Eating habits.  History of falls.  Memory and ability to understand (cognition).  Work and work Statistician. Screening  You may have the following tests or measurements:  Height, weight, and BMI.  Blood pressure.  Lipid and cholesterol levels. These may be checked every 5 years, or more frequently if you are over 73 years old.  Skin check.  Lung cancer screening. You may have this screening every year starting at age 40 if you have a 30-pack-year history of smoking and currently smoke or have quit within the past 15 years.  Fecal occult blood test (FOBT) of the stool. You may have this test every year starting at age 51.  Flexible sigmoidoscopy or colonoscopy. You may have a sigmoidoscopy every 5 years or a colonoscopy every 10 years starting at age 12.  Prostate cancer screening. Recommendations will vary depending on your family history and other risks.  Hepatitis C blood test.  Hepatitis B blood test.  Sexually transmitted disease (STD) testing.  Diabetes screening. This is done by checking your blood sugar (glucose) after you have not eaten for a while (fasting). You may have this done every 1-3 years.  Abdominal aortic aneurysm (AAA) screening. You may need this if you are a current or former smoker.  Osteoporosis. You may be screened starting at age 64 if you are at high risk. Talk with your health care provider about your test results, treatment options, and if necessary, the need for more tests. Vaccines  Your health care provider may recommend certain  vaccines, such as:  Influenza vaccine. This is recommended every year.  Tetanus, diphtheria, and acellular pertussis (Tdap, Td) vaccine. You may need a Td booster every 10 years.  Zoster vaccine. You may need this after age  59.  Pneumococcal 13-valent conjugate (PCV13) vaccine. One dose is recommended after age 59.  Pneumococcal polysaccharide (PPSV23) vaccine. One dose is recommended after age 21. Talk to your health care provider about which screenings and vaccines you need and how often you need them. This information is not intended to replace advice given to you by your health care provider. Make sure you discuss any questions you have with your health care provider. Document Released: 10/27/2015 Document Revised: 06/19/2016 Document Reviewed: 08/01/2015 Elsevier Interactive Patient Education  2017 [place] Prevention in the Home Falls can cause injuries. They can happen to people of all ages. There are many things you can do to make your home safe and to help prevent falls. What can I do on the outside of my home?  Regularly fix the edges of walkways and driveways and fix any cracks.  Remove anything that might make you trip as you walk through a door, such as a raised step or threshold.  Trim any bushes or trees on the path to your home.  Use bright outdoor lighting.  Clear any walking paths of anything that might make someone trip, such as rocks or tools.  Regularly check to see if handrails are loose or broken. Make sure that both sides of any steps have handrails.  Any raised decks and porches should have guardrails on the edges.  Have any leaves, snow, or ice cleared regularly.  Use sand or salt on walking paths during winter.  Clean up any spills in your garage right away. This includes oil or grease spills. What can I do in the bathroom?  Use night lights.  Install grab bars by the toilet and in the tub and shower. Do not use towel bars as grab bars.  Use non-skid mats or decals in the tub or shower.  If you need to sit down in the shower, use a plastic, non-slip stool.  Keep the floor dry. Clean up any water that spills on the floor as soon as it happens.  Remove  soap buildup in the tub or shower regularly.  Attach bath mats securely with double-sided non-slip rug tape.  Do not have throw rugs and other things on the floor that can make you trip. What can I do in the bedroom?  Use night lights.  Make sure that you have a light by your bed that is easy to reach.  Do not use any sheets or blankets that are too big for your bed. They should not hang down onto the floor.  Have a firm chair that has side arms. You can use this for support while you get dressed.  Do not have throw rugs and other things on the floor that can make you trip. What can I do in the kitchen?  Clean up any spills right away.  Avoid walking on wet floors.  Keep items that you use a lot in easy-to-reach places.  If you need to reach something above you, use a strong step stool that has a grab bar.  Keep electrical cords out of the way.  Do not use floor polish or wax that makes floors slippery. If you must use wax, use non-skid floor wax.  Do not have throw rugs and other things  on the floor that can make you trip. What can I do with my stairs?  Do not leave any items on the stairs.  Make sure that there are handrails on both sides of the stairs and use them. Fix handrails that are broken or loose. Make sure that handrails are as long as the stairways.  Check any carpeting to make sure that it is firmly attached to the stairs. Fix any carpet that is loose or worn.  Avoid having throw rugs at the top or bottom of the stairs. If you do have throw rugs, attach them to the floor with carpet tape.  Make sure that you have a light switch at the top of the stairs and the bottom of the stairs. If you do not have them, ask someone to add them for you. What else can I do to help prevent falls?  Wear shoes that:  Do not have high heels.  Have rubber bottoms.  Are comfortable and fit you well.  Are closed at the toe. Do not wear sandals.  If you use a  stepladder:  Make sure that it is fully opened. Do not climb a closed stepladder.  Make sure that both sides of the stepladder are locked into place.  Ask someone to hold it for you, if possible.  Clearly mark and make sure that you can see:  Any grab bars or handrails.  First and last steps.  Where the edge of each step is.  Use tools that help you move around (mobility aids) if they are needed. These include:  Canes.  Walkers.  Scooters.  Crutches.  Turn on the lights when you go into a dark area. Replace any light bulbs as soon as they burn out.  Set up your furniture so you have a clear path. Avoid moving your furniture around.  If any of your floors are uneven, fix them.  If there are any pets around you, be aware of where they are.  Review your medicines with your doctor. Some medicines can make you feel dizzy. This can increase your chance of falling. Ask your doctor what other things that you can do to help prevent falls. This information is not intended to replace advice given to you by your health care provider. Make sure you discuss any questions you have with your health care provider. Document Released: 07/27/2009 Document Revised: 03/07/2016 Document Reviewed: 11/04/2014 Elsevier Interactive Patient Education  2017 [employer].

## 2021-01-02 NOTE — Progress Notes (Signed)
Subjective:   Christian Griffith is a 65 y.o. male who presents for Medicare Annual/Subsequent preventive examination.  Virtual Visit via Telephone Note  I connected with  Christian Griffith on 01/02/21 at 11:20 AM EDT by telephone and verified that I am speaking with the correct person using two identifiers.  Location: Patient: home Provider: [place] Persons participating in the virtual visit: [place]   I discussed the limitations, risks, security and privacy concerns of performing an evaluation and management service by telephone and the availability of in person appointments. The patient expressed understanding and agreed to proceed.  Interactive audio and video telecommunications were attempted between this nurse and patient, however failed, due to patient having technical difficulties OR patient did not have access to video capability.  We continued and completed visit with audio only.  Some vital signs may be absent or patient reported.   Clemetine Marker, LPN   Review of Systems     Cardiac Risk Factors include: male gender;advanced age (>58men, >49 women)     Objective:    There were no vitals filed for this visit. There is no height or weight on file to calculate BMI.  Advanced Directives 01/02/2021 12/28/2019 07/18/2017 01/21/2017 07/16/2016 01/08/2016 07/10/2015  Does Patient Have a Medical Advance Directive? Yes Yes Yes Yes Yes Yes Yes  Type of Paramedic of [place];Living will Griffith;Living will - [place];Living will Living will [place];Living will [place];Living will  Does patient want to make changes to medical advance directive? - - - - - No - Patient declined -  Copy of [place] in Chart? No - copy requested No - copy requested - No - copy requested - No - copy requested No - copy requested    Current Medications  (verified) Outpatient Encounter Medications as of 01/02/2021  Medication Sig  . aspirin 81 MG tablet Take 81 mg by mouth daily.  . cholecalciferol (VITAMIN D) 1000 UNITS tablet Take by mouth.  . Cyanocobalamin (B-12) 1000 MCG SUBL Place 1 tablet under the tongue daily.  Javier Docker Oil 300 MG CAPS Take by mouth.  . Thiamine Mononitrate (VITAMIN B1) 100 MG TABS Take 1 tablet by mouth once a week.  . verapamil (CALAN-SR) 180 MG CR tablet TAKE ONE (1) TABLET EACH DAY  . fluticasone (FLONASE) 50 MCG/ACT nasal spray Place 2 sprays into both nostrils daily. (Patient not taking: Reported on 01/02/2021)  . ipratropium (ATROVENT) 0.06 % nasal spray Place 2 sprays into both nostrils 4 (four) times daily. (Patient not taking: Reported on 01/02/2021)   No facility-administered encounter medications on file as of 01/02/2021.    Allergies (verified) Patient has no known allergies.   History: Past Medical History:  Diagnosis Date  . Allergy   . Deficiency of vitamin B   . Dyslipidemia   . Encephalomyeloneuropathy   . History of alcohol abuse   . Hypertension   . Other emphysema (Christian Griffith)   . Seizure disorder, primary (Christian Griffith)   . Vitamin D deficiency   . Wernicke-Korsakoff syndrome (alcoholic) (HCC)    Past Surgical History:  Procedure Laterality Date  . VASECTOMY     Family History  Problem Relation Age of Onset  . Dementia Mother   . Hypertension Father    Social History   Socioeconomic History  . Marital status: Married    Spouse name: Christian Griffith  . Number of children: 2  .  Years of education: Not on file  . Highest education level: High school graduate  Occupational History  . Occupation: Retired  Tobacco Use  . Smoking status: Former Smoker    Packs/day: 1.00    Years: 40.00    Pack years: 40.00    Types: Cigarettes    Start date: 07/10/1975    Quit date: 10/14/2018    Years since quitting: 2.2  . Smokeless tobacco: Never Used  Vaping Use  . Vaping Use: Former  . Start date: 05/18/2014   . Quit date: 07/18/2014  Substance and Sexual Activity  . Alcohol use: No    Alcohol/week: 0.0 standard drinks    Comment: used to be a heavy drinker  . Drug use: No  . Sexual activity: Yes    Partners: Female  Other Topics Concern  . Not on file  Social History Narrative   2 children and 1 granddaughter and 1 grandson    They have a house in [place] and one at [facility] area   Social Determinants of Health   Financial Resource Strain: [place]   . Difficulty of Paying Living Expenses: Not hard at all  Food Insecurity: No Food Insecurity  . Worried About Charity fundraiser in the Last Year: Never true  . Ran Out of Food in the Last Year: Never true  Transportation Needs: No Transportation Needs  . Lack of Transportation (Medical): No  . Lack of Transportation (Non-Medical): No  Physical Activity: Sufficiently Active  . Days of Exercise per Week: 7 days  . Minutes of Exercise per Session: 30 min  Stress: No Stress Concern Present  . Feeling of Stress : Not at all  Social Connections: Moderately Integrated  . Frequency of Communication with Friends and Family: More than three times a week  . Frequency of Social Gatherings with Friends and Family: More than three times a week  . Attends Religious Services: Never  . Active Member of Clubs or Organizations: Yes  . Attends Archivist Meetings: More than 4 times per year  . Marital Status: Married    Tobacco Counseling Counseling given: Not Answered   Clinical Intake:  Pre-visit preparation completed: Yes  Pain : No/denies pain     Nutritional Risks: None Diabetes: No  How often do you need to have someone help you when you read instructions, pamphlets, or other written materials from your doctor or pharmacy?: 1 - Never    Interpreter Needed?: No  Information entered by :: Clemetine Marker LPN   Activities of Daily Living In your present state of health, do you have any difficulty performing the  following activities: 01/02/2021 12/26/2020  Hearing? N N  Comment declines hearing aids -  Vision? N N  Difficulty concentrating or making decisions? N N  Walking or climbing stairs? N N  Dressing or bathing? N N  Doing errands, shopping? N N  Preparing Food and eating ? N -  Using the Toilet? N -  In the past six months, have you accidently leaked urine? N -  Do you have problems with loss of bowel control? N -  Managing your Medications? N -  Managing your Finances? N -  Housekeeping or managing your Housekeeping? N -  Some recent data might be hidden    Patient Care Team: Steele Sizer, MD as PCP - General (Family Medicine)  Indicate any recent Medical Services you may have received from other than [facility] providers in the past year (date may  be approximate).     Assessment:   This is a routine wellness examination for [place].  Hearing/Vision screen  Hearing Screening   125Hz  250Hz  500Hz  1000Hz  2000Hz  3000Hz  4000Hz  6000Hz  8000Hz   Right ear:           Left ear:           Comments: Pt denies hearing difficulty   Vision Screening Comments: Annual vision screenings done at [place] issues and exercise activities discussed: Current Exercise Habits: Home exercise routine, Type of exercise: Other - see comments (yard work), Time (Minutes): 30, Frequency (Times/Week): 7, Weekly Exercise (Minutes/Week): 210, Intensity: Moderate, Exercise limited by: None identified  Goals    . Patient Stated     Patient states he would like to remain active and healthy; golfing, fishing, mowing yards      Depression Screen PHQ 2/9 Scores 01/02/2021 12/26/2020 11/13/2020 08/30/2020 12/28/2019 08/02/2019 08/17/2018  PHQ - 2 Score 0 0 0 0 0 0 0  PHQ- 9 Score - - - - - 0 0    Fall Risk Fall Risk  01/02/2021 12/26/2020 11/13/2020 08/30/2020 12/28/2019  Falls in the past year? 0 0 0 0 0  Number falls in past yr: 0 0 0 0 0  Injury with Fall? - 0 0 0 0  Risk for fall due to : No Fall  Risks - - - -  Follow up Falls prevention discussed - - - Falls prevention discussed    FALL RISK PREVENTION PERTAINING TO THE HOME:  Any stairs in or around the home? Yes  If so, are there any without handrails? No  Home free of loose throw rugs in walkways, pet beds, electrical cords, etc? Yes  Adequate lighting in your home to reduce risk of falls? Yes   ASSISTIVE DEVICES UTILIZED TO PREVENT FALLS:  Life alert? No  Use of a cane, walker or w/c? No  Grab bars in the bathroom? Yes  Shower chair or bench in shower? No  Elevated toilet seat or a handicapped toilet? Yes   TIMED UP AND GO:  Was the test performed? No . Telephonic visit  Cognitive Function: Normal cognitive status assessed by direct observation by this Nurse Health Advisor. No abnormalities found.       6CIT Screen 12/28/2019 07/18/2017  What Year? 0 points 4 points  What month? 0 points 0 points  What time? 0 points 0 points  Count back from 20 0 points 0 points  Months in reverse 0 points 0 points  Repeat phrase 4 points 6 points  Total Score 4 10    Immunizations Immunization History  Administered Date(s) Administered  . Pneumococcal Conjugate-13 07/18/2017  . Pneumococcal Polysaccharide-23 07/05/2014, 08/02/2019  . Tdap 07/05/2014    TDAP status: Up to date  Flu Vaccine status: Declined, Education has been provided regarding the importance of this vaccine but patient still declined. Advised may receive this vaccine at local pharmacy or Health Dept. Aware to provide a copy of the vaccination record if obtained from local pharmacy or Health Dept. Verbalized acceptance and understanding.  Pneumococcal vaccine status: Up to date  Covid-19 vaccine status: Declined, Education has been provided regarding the importance of this vaccine but patient still declined. Advised may receive this vaccine at local pharmacy or Health Dept.or vaccine clinic. Aware to provide a copy of the vaccination record if obtained  from local pharmacy or Health Dept. Verbalized acceptance and understanding.  Qualifies for Shingles Vaccine? Yes   Zostavax  completed No   Shingrix Completed?: No.    Education has been provided regarding the importance of this vaccine. Patient has been advised to call insurance company to determine out of pocket expense if they have not yet received this vaccine. Advised may also receive vaccine at local pharmacy or Health Dept. Verbalized acceptance and understanding.  Screening Tests Health Maintenance  Topic Date Due  . COVID-19 Vaccine (1) 01/11/2021 (Originally 10/17/1960)  . INFLUENZA VACCINE  07/25/2022 (Originally 05/14/2020)  . COLON CANCER SCREENING ANNUAL FOBT  08/14/2021  . TETANUS/TDAP  07/05/2024  . Hepatitis C Screening  Completed  . PNA vac Low Risk Adult  Completed  . HPV VACCINES  Aged Out  . COLONOSCOPY (Pts 45-36yrs Insurance coverage will need to be confirmed)  Discontinued    Health Maintenance  There are no preventive care reminders to display for this patient.  Colorectal cancer screening: Type of screening: FOBT/FIT. Completed 08/14/20. Repeat every 1 years  Lung Cancer Screening: (Low Dose CT Chest recommended if Age 14-80 years, 30 pack-year currently smoking OR have quit w/in 15years.) does qualify. Pt declined.   Additional Screening:  Hepatitis C Screening: does qualify; Completed 07/05/14  Vision Screening: Recommended annual ophthalmology exams for early detection of glaucoma and other disorders of the eye. Is the patient up to date with their annual eye exam?  No  Who is the provider or what is the name of the office in which the patient attends annual eye exams? [facility]  Dental Screening: Recommended annual dental exams for proper oral hygiene  Community Resource Referral / Chronic Care Management: CRR required this visit?  No   CCM required this visit?  No      Plan:     I have personally reviewed and noted the following in the  patient's chart:   . Medical and social history . Use of alcohol, tobacco or illicit drugs  . Current medications and supplements . Functional ability and status . Nutritional status . Physical activity . Advanced directives . List of other physicians . Hospitalizations, surgeries, and ER visits in previous 12 months . Vitals . Screenings to include cognitive, depression, and falls . Referrals and appointments  In addition, I have reviewed and discussed with patient certain preventive protocols, quality metrics, and best practice recommendations. A written personalized care plan for preventive services as well as general preventive health recommendations were provided to patient.     Clemetine Marker, LPN   [id number]   Nurse Notes: none

## 2021-03-20 DIAGNOSIS — H5203 Hypermetropia, bilateral: Secondary | ICD-10-CM | POA: Diagnosis not present

## 2021-03-22 DIAGNOSIS — H5203 Hypermetropia, bilateral: Secondary | ICD-10-CM | POA: Diagnosis not present

## 2021-06-28 NOTE — Progress Notes (Addendum)
Name: Christian Griffith   MRN: [id number]    DOB: [birth year]   Date:06/29/2021       Progress Note  Subjective  Chief Complaint  Follow Up  HPI  HTN: taking medication, no side effects, denies constipation or swelling, no palpitation, dizziness or chest pain . BP is at goal.    Hyperlipidemia: he refused medication in the past, only on otc Krill oil, last LDL was 134 , HDL 46, triglycerides 136. He continues to refused medication  The 10-year ASCVD risk score (Arnett DK, et al., 2019) is: 18.1%   Values used to calculate the score:     Age: 65 years     Sex: Male     Is Non-Hispanic African American: No     Diabetic: No     Tobacco smoker: No     Systolic Blood Pressure: 0000000 mmHg     Is BP treated: Yes     HDL Cholesterol: 46 mg/dL     Total Cholesterol: 206 mg/dL    COPD: he quit smoking back in  January 2020, he is very active, likes working outside, still going fishing . Denies SOB  but still has an occasional cough. No wheezing, he has good exercise tolerance. He is not interested in medications    History of alcoholism: that caused him to be admitted with encephalopathy back in 2014 , doing well since he quit drinking. Feeling better, memory is good now, he is taking vitamin super B complex three times a week, we will recheck level today, may need to resume B12 sub lingually    Vitamin D def : he is taking otc vitamin D , last level slightly low, but 29, continue supplementation and recheck level today    AR: he takes otc loratadin, he has chronic rhinorrhea - usually in am's  but no congestion. He uses it prn   Patient Active Problem List   Diagnosis Date Noted   B12 deficiency 11/10/2020   Dyslipidemia 07/07/2015   History of tobacco abuse 07/07/2015   Deficiency of vitamin B 07/07/2015   Vitamin D deficiency 07/07/2015   History of alcoholism ([place]) 07/07/2015   Hay fever 04/12/2009   Benign essential HTN 04/12/2009   COPD, mild ([place]) 04/12/2009    Past Surgical  History:  Procedure Laterality Date   VASECTOMY      Family History  Problem Relation Age of Onset   Dementia Mother    Hypertension Father     Social History   Tobacco Use   Smoking status: Former    Packs/day: 1.00    Years: 40.00    Pack years: 40.00    Types: Cigarettes    Start date: 07/10/1975    Quit date: 10/14/2018    Years since quitting: 2.7   Smokeless tobacco: Never  Substance Use Topics   Alcohol use: No    Alcohol/week: 0.0 standard drinks    Comment: used to be a heavy drinker     Current Outpatient Medications:    aspirin 81 MG tablet, Take 81 mg by mouth daily., Disp: , Rfl:    cholecalciferol (VITAMIN D) 1000 UNITS tablet, Take by mouth., Disp: , Rfl:    Cyanocobalamin (B-12) 1000 MCG SUBL, Place 1 tablet under the tongue daily., Disp: 30 tablet, Rfl: 0   Krill Oil 300 MG CAPS, Take by mouth., Disp: , Rfl:    Thiamine Mononitrate (VITAMIN B1) 100 MG TABS, Take 1 tablet by mouth once a week., Disp: , Rfl:  verapamil (CALAN-SR) 180 MG CR tablet, TAKE ONE (1) TABLET EACH DAY, Disp: 90 tablet, Rfl: 3   fluticasone (FLONASE) 50 MCG/ACT nasal spray, Place 2 sprays into both nostrils daily. (Patient not taking: No sig reported), Disp: 48 g, Rfl: 1   ipratropium (ATROVENT) 0.06 % nasal spray, Place 2 sprays into both nostrils 4 (four) times daily. (Patient not taking: No sig reported), Disp: 15 mL, Rfl: 2  No Known Allergies  I personally reviewed active problem list, medication list, allergies, family history, social history, health maintenance with the patient/caregiver today.   ROS  Constitutional: Negative for fever or weight change.  Respiratory: positive  for cough but no  shortness of breath.   Cardiovascular: Negative for chest pain or palpitations.  Gastrointestinal: Negative for abdominal pain, no bowel changes.  Musculoskeletal: Negative for gait problem or joint swelling.  Skin: Negative for rash.  Neurological: Negative for dizziness or  headache.  No other specific complaints in a complete review of systems (except as listed in HPI above).   Objective  Vitals:   06/29/21 0933  BP: 128/82  Pulse: 85  Resp: 16  Temp: 97.6 F (36.4 C)  SpO2: 97%  Weight: 156 lb (70.8 kg)  Height: '5\' 3"'$  (1.6 m)    Body mass index is 27.63 kg/m.  Physical Exam  Constitutional: Patient appears well-developed and well-nourished. Overweight.  No distress.  HEENT: head atraumatic, normocephalic, pupils equal and reactive to light, neck supple Cardiovascular: Normal rate, regular rhythm and normal heart sounds.  No murmur heard. No BLE edema. Pulmonary/Chest: Effort normal and breath sounds has rhonchi. No respiratory distress. Abdominal: Soft.  There is no tenderness. Psychiatric: Patient has a normal mood and affect. behavior is normal. Judgment and thought content normal.   PHQ2/9: Depression screen [facility] 2/9 06/29/2021 01/02/2021 12/26/2020 11/13/2020 08/30/2020  Decreased Interest 0 0 0 0 0  Down, Depressed, Hopeless 0 0 0 0 0  PHQ - 2 Score 0 0 0 0 0  Altered sleeping - - - - -  Tired, decreased energy - - - - -  Change in appetite - - - - -  Feeling bad or failure about yourself  - - - - -  Trouble concentrating - - - - -  Moving slowly or fidgety/restless - - - - -  Suicidal thoughts - - - - -  PHQ-9 Score - - - - -  Difficult doing work/chores - - - - -    phq 9 is negative   Fall Risk: Fall Risk  06/29/2021 01/02/2021 12/26/2020 11/13/2020 08/30/2020  Falls in the past year? 0 0 0 0 0  Number falls in past yr: 0 0 0 0 0  Injury with Fall? 0 - 0 0 0  Risk for fall due to : No Fall Risks No Fall Risks - - -  Follow up Falls prevention discussed Falls prevention discussed - - -      Functional Status Survey: Is the patient deaf or have difficulty hearing?: No Does the patient have difficulty seeing, even when wearing glasses/contacts?: No Does the patient have difficulty concentrating, remembering, or making  decisions?: No Does the patient have difficulty walking or climbing stairs?: No Does the patient have difficulty dressing or bathing?: No Does the patient have difficulty doing errands alone such as visiting a doctor's office or shopping?: No    Assessment & Plan  1. COPD, mild ([place])  Not interested in medication, quit smoking   2. Benign essential HTN  -  COMPLETE METABOLIC PANEL WITH GFR - CBC with Differential/Platelet  3. Vitamin D deficiency  - VITAMIN D 25 Hydroxy (Vit-D Deficiency, Fractures)  4. History of alcoholism ([place])   5. Dyslipidemia  - Lipid panel  6. Vitamin B12 deficiency  - Vitamin B12  7. Vitamin B1 deficiency  - Vitamin B1

## 2021-06-29 ENCOUNTER — Other Ambulatory Visit: Payer: Self-pay

## 2021-06-29 ENCOUNTER — Ambulatory Visit (INDEPENDENT_AMBULATORY_CARE_PROVIDER_SITE_OTHER): Payer: Medicare HMO | Admitting: Family Medicine

## 2021-06-29 ENCOUNTER — Encounter: Payer: Self-pay | Admitting: Family Medicine

## 2021-06-29 VITALS — BP 128/82 | HR 85 | Temp 97.6°F | Resp 16 | Ht 63.0 in | Wt 156.0 lb

## 2021-06-29 DIAGNOSIS — E559 Vitamin D deficiency, unspecified: Secondary | ICD-10-CM | POA: Diagnosis not present

## 2021-06-29 DIAGNOSIS — E519 Thiamine deficiency, unspecified: Secondary | ICD-10-CM

## 2021-06-29 DIAGNOSIS — E538 Deficiency of other specified B group vitamins: Secondary | ICD-10-CM

## 2021-06-29 DIAGNOSIS — J449 Chronic obstructive pulmonary disease, unspecified: Secondary | ICD-10-CM

## 2021-06-29 DIAGNOSIS — Z23 Encounter for immunization: Secondary | ICD-10-CM

## 2021-06-29 DIAGNOSIS — I10 Essential (primary) hypertension: Secondary | ICD-10-CM

## 2021-06-29 DIAGNOSIS — R69 Illness, unspecified: Secondary | ICD-10-CM | POA: Diagnosis not present

## 2021-06-29 DIAGNOSIS — F1021 Alcohol dependence, in remission: Secondary | ICD-10-CM | POA: Diagnosis not present

## 2021-06-29 DIAGNOSIS — E785 Hyperlipidemia, unspecified: Secondary | ICD-10-CM | POA: Diagnosis not present

## 2021-06-29 MED ORDER — SHINGRIX 50 MCG/0.5ML IM SUSR: 0.5000 mL | Freq: Once | INTRAMUSCULAR | 1 refills | Status: AC

## 2021-07-03 LAB — COMPLETE METABOLIC PANEL WITH GFR
AG Ratio: 1.9 (calc) (ref 1.0–2.5)
ALT: 14 U/L (ref 9–46)
AST: 16 U/L (ref 10–35)
Albumin: 4.6 g/dL (ref 3.6–5.1)
Alkaline phosphatase (APISO): 67 U/L (ref 35–144)
BUN: 11 mg/dL (ref 7–25)
CO2: 30 mmol/L (ref 20–32)
Calcium: 9.8 mg/dL (ref 8.6–10.3)
Chloride: 104 mmol/L (ref 98–110)
Creat: 0.79 mg/dL (ref 0.70–1.35)
Globulin: 2.4 g/dL (calc) (ref 1.9–3.7)
Glucose, Bld: 87 mg/dL (ref 65–99)
Potassium: 4.4 mmol/L (ref 3.5–5.3)
Sodium: 140 mmol/L (ref 135–146)
Total Bilirubin: 0.7 mg/dL (ref 0.2–1.2)
Total Protein: 7 g/dL (ref 6.1–8.1)
eGFR: 97 mL/min/{1.73_m2} (ref >=60)

## 2021-07-03 LAB — VITAMIN B1: Vitamin B1 (Thiamine): 11 nmol/L (ref 8–30)

## 2021-07-03 LAB — CBC WITH DIFFERENTIAL/PLATELET
Absolute Monocytes: 695 cells/uL (ref 200–950)
Basophils Absolute: 79 cells/uL (ref 0–200)
Basophils Relative: 0.9 %
Eosinophils Absolute: 282 cells/uL (ref 15–500)
Eosinophils Relative: 3.2 %
HCT: 45.3 % (ref 38.5–50.0)
Hemoglobin: 15.4 g/dL (ref 13.2–17.1)
Lymphs Abs: 2262 cells/uL (ref 850–3900)
MCH: 30.5 pg (ref 27.0–33.0)
MCHC: 34 g/dL (ref 32.0–36.0)
MCV: 89.7 fL (ref 80.0–100.0)
MPV: 11.6 fL (ref 7.5–12.5)
Monocytes Relative: 7.9 %
Neutro Abs: 5482 cells/uL (ref 1500–7800)
Neutrophils Relative %: 62.3 %
Platelets: 175 10*3/uL (ref 140–400)
RBC: 5.05 10*6/uL (ref 4.20–5.80)
RDW: 13.1 % (ref 11.0–15.0)
Total Lymphocyte: 25.7 %
WBC: 8.8 10*3/uL (ref 3.8–10.8)

## 2021-07-03 LAB — LIPID PANEL
Cholesterol: 203 mg/dL — ABNORMAL HIGH (ref <200)
HDL: 49 mg/dL (ref >=40)
LDL Cholesterol (Calc): 132 mg/dL (calc) — ABNORMAL HIGH
Non-HDL Cholesterol (Calc): 154 mg/dL (calc) — ABNORMAL HIGH (ref <130)
Total CHOL/HDL Ratio: 4.1 (calc) (ref <5.0)
Triglycerides: 108 mg/dL (ref <150)

## 2021-07-03 LAB — VITAMIN B12: Vitamin B-12: 639 pg/mL (ref 200–1100)

## 2021-07-03 LAB — VITAMIN D 25 HYDROXY (VIT D DEFICIENCY, FRACTURES): Vit D, 25-Hydroxy: 35 ng/mL (ref 30–100)

## 2021-08-16 LAB — FECAL OCCULT BLOOD, IMMUNOCHEMICAL: IFOBT: POSITIVE

## 2021-08-28 ENCOUNTER — Other Ambulatory Visit: Payer: Self-pay

## 2021-08-28 DIAGNOSIS — Z1211 Encounter for screening for malignant neoplasm of colon: Secondary | ICD-10-CM

## 2021-08-29 ENCOUNTER — Telehealth: Payer: Self-pay

## 2021-08-29 NOTE — Telephone Encounter (Signed)
Referral is in the workque.

## 2021-08-29 NOTE — Telephone Encounter (Signed)
Pt is ready to schedule colonoscopy

## 2021-09-05 ENCOUNTER — Other Ambulatory Visit: Payer: Self-pay

## 2021-09-05 DIAGNOSIS — Z1211 Encounter for screening for malignant neoplasm of colon: Secondary | ICD-10-CM

## 2021-09-05 MED ORDER — NA SULFATE-K SULFATE-MG SULF 17.5-3.13-1.6 GM/177ML PO SOLN: 1.0000 | Freq: Once | ORAL | 0 refills | Status: AC

## 2021-09-05 NOTE — Telephone Encounter (Signed)
Pt stated that he will be moving from [facility] by 09/22/21. And was trying to get scheduled if all possible before then.

## 2021-09-05 NOTE — Progress Notes (Signed)
Gastroenterology Pre-Procedure Review  Request Date: 09/11/21 Requesting Physician: Dr. Vicente Males  PATIENT REVIEW QUESTIONS: The patient responded to the following health history questions as indicated:    1. Are you having any GI issues? no 2. Do you have a personal history of Polyps? no 3. Do you have a family history of Colon Cancer or Polyps? no 4. Diabetes Mellitus? no 5. Joint replacements in the past 12 months?no 6. Major health problems in the past 3 months?no 7. Any artificial heart valves, MVP, or defibrillator?no    MEDICATIONS & ALLERGIES:    Patient reports the following regarding taking any anticoagulation/antiplatelet therapy:   Plavix, Coumadin, Eliquis, Xarelto, Lovenox, Pradaxa, Brilinta, or Effient? no Aspirin? yes (81 mg)  Patient confirms/reports the following medications:  Current Outpatient Medications  Medication Sig Dispense Refill   aspirin 81 MG tablet Take 81 mg by mouth daily.     cholecalciferol (VITAMIN D) 1000 UNITS tablet Take by mouth.     Cyanocobalamin (B-12) 1000 MCG SUBL Place 1 tablet under the tongue daily. 30 tablet 0   fluticasone (FLONASE) 50 MCG/ACT nasal spray Place 2 sprays into both nostrils daily. (Patient not taking: No sig reported) 48 g 1   ipratropium (ATROVENT) 0.06 % nasal spray Place 2 sprays into both nostrils 4 (four) times daily. (Patient not taking: No sig reported) 15 mL 2   Krill Oil 300 MG CAPS Take by mouth.     Thiamine Mononitrate (VITAMIN B1) 100 MG TABS Take 1 tablet by mouth once a week.     verapamil (CALAN-SR) 180 MG CR tablet TAKE ONE (1) TABLET EACH DAY 90 tablet 3   No current facility-administered medications for this visit.    Patient confirms/reports the following allergies:  No Known Allergies  No orders of the defined types were placed in this encounter.   AUTHORIZATION INFORMATION Primary Insurance: 1D#: Group #:  Secondary Insurance: 1D#: Group #:  SCHEDULE INFORMATION: Date:  09/11/21 Time: Location: [place]

## 2021-09-05 NOTE — Telephone Encounter (Signed)
Procedure scheduled for 09/11/21.

## 2021-09-11 ENCOUNTER — Ambulatory Visit
Admission: RE | Admit: 2021-09-11 | Discharge: 2021-09-11 | Disposition: A | Discharge status: Home / Self Care | Payer: Medicare HMO | Attending: Gastroenterology | Admitting: Gastroenterology

## 2021-09-11 ENCOUNTER — Encounter
Admission: RE | Disposition: A | Discharge status: Home / Self Care | Payer: Self-pay | Source: Home / Self Care | Attending: Gastroenterology

## 2021-09-11 ENCOUNTER — Ambulatory Visit: Payer: Medicare HMO | Admitting: Certified Registered"

## 2021-09-11 DIAGNOSIS — Z87891 Personal history of nicotine dependence: Secondary | ICD-10-CM | POA: Diagnosis not present

## 2021-09-11 DIAGNOSIS — E785 Hyperlipidemia, unspecified: Secondary | ICD-10-CM | POA: Diagnosis not present

## 2021-09-11 DIAGNOSIS — J449 Chronic obstructive pulmonary disease, unspecified: Secondary | ICD-10-CM | POA: Diagnosis not present

## 2021-09-11 DIAGNOSIS — I10 Essential (primary) hypertension: Secondary | ICD-10-CM | POA: Insufficient documentation

## 2021-09-11 DIAGNOSIS — D126 Benign neoplasm of colon, unspecified: Secondary | ICD-10-CM

## 2021-09-11 DIAGNOSIS — Z1211 Encounter for screening for malignant neoplasm of colon: Secondary | ICD-10-CM | POA: Diagnosis not present

## 2021-09-11 DIAGNOSIS — R569 Unspecified convulsions: Secondary | ICD-10-CM | POA: Diagnosis not present

## 2021-09-11 DIAGNOSIS — K621 Rectal polyp: Secondary | ICD-10-CM | POA: Insufficient documentation

## 2021-09-11 DIAGNOSIS — D122 Benign neoplasm of ascending colon: Secondary | ICD-10-CM | POA: Diagnosis not present

## 2021-09-11 DIAGNOSIS — K635 Polyp of colon: Secondary | ICD-10-CM | POA: Insufficient documentation

## 2021-09-11 HISTORY — PX: COLONOSCOPY WITH PROPOFOL: SHX5780

## 2021-09-11 SURGERY — COLONOSCOPY WITH PROPOFOL
Anesthesia: General

## 2021-09-11 MED ORDER — PROPOFOL 500 MG/50ML IV EMUL
INTRAVENOUS | Status: AC
  Filled 2021-09-11: qty 100

## 2021-09-11 MED ORDER — SIMETHICONE 40 MG/0.6ML PO SUSP
ORAL | Status: DC | PRN
  Administered 2021-09-11: 60 mL via ORAL

## 2021-09-11 MED ORDER — PROPOFOL 500 MG/50ML IV EMUL
INTRAVENOUS | Status: DC | PRN
  Administered 2021-09-11: 120 ug/kg/min via INTRAVENOUS

## 2021-09-11 MED ORDER — SODIUM CHLORIDE 0.9 % IV SOLN: INTRAVENOUS | Status: DC

## 2021-09-11 MED ORDER — PROPOFOL 10 MG/ML IV BOLUS
INTRAVENOUS | Status: DC | PRN
  Administered 2021-09-11: 70 mg via INTRAVENOUS

## 2021-09-11 NOTE — Anesthesia Preprocedure Evaluation (Signed)
Anesthesia Evaluation  Patient identified by MRN, date of birth, ID band Patient awake    Reviewed: Allergy & Precautions, H&P , NPO status , Patient's Chart, lab work & pertinent test results, reviewed documented beta blocker date and time   Airway Mallampati: II   Neck ROM: full    Dental  (+) Poor Dentition   Pulmonary COPD, Patient abstained from smoking., former smoker,    Pulmonary exam normal        Cardiovascular Exercise Tolerance: Good hypertension, On Medications negative cardio ROS Normal cardiovascular exam Rhythm:regular Rate:Normal     Neuro/Psych Seizures -,  PSYCHIATRIC DISORDERS    GI/Hepatic negative GI ROS, Neg liver ROS,   Endo/Other  negative endocrine ROS  Renal/GU negative Renal ROS  negative genitourinary   Musculoskeletal   Abdominal   Peds  Hematology negative hematology ROS (+)   Anesthesia Other Findings Past Medical History: No date: Allergy No date: Deficiency of vitamin B No date: Dyslipidemia No date: Encephalomyeloneuropathy No date: History of alcohol abuse No date: Hypertension No date: Other emphysema ([place]) No date: Seizure disorder, primary ([place]) No date: Vitamin D deficiency No date: Wernicke-Korsakoff syndrome (alcoholic) ([place]) Past Surgical History: No date: VASECTOMY BMI    Body Mass Index: 24.43 kg/m     Reproductive/Obstetrics negative OB ROS                             Anesthesia Physical Anesthesia Plan  ASA: 3  Anesthesia Plan: General   Post-op Pain Management:    Induction:   PONV Risk Score and Plan:   Airway Management Planned:   Additional Equipment:   Intra-op Plan:   Post-operative Plan:   Informed Consent: I have reviewed the patients History and Physical, chart, labs and discussed the procedure including the risks, benefits and alternatives for the proposed anesthesia with the patient or authorized  representative who has indicated his/her understanding and acceptance.     Dental Advisory Given  Plan Discussed with: CRNA  Anesthesia Plan Comments:         Anesthesia Quick Evaluation

## 2021-09-11 NOTE — H&P (Signed)
Christian Bellows, MD [address] [place], [place], [place], [address] [id number], [place], [place], [place], [address] Phone: [phone]  Fax: [phone]  Primary Care Physician:  Steele Sizer, MD   Pre-Procedure History & Physical: HPI:  Christian Griffith is a 65 y.o. male is here for an colonoscopy.   Past Medical History:  Diagnosis Date   Allergy    Deficiency of vitamin B    Dyslipidemia    Encephalomyeloneuropathy    History of alcohol abuse    Hypertension    Other emphysema ([facility])    Seizure disorder, primary ([facility])    Vitamin D deficiency    Wernicke-Korsakoff syndrome (alcoholic) ([place])     Past Surgical History:  Procedure Laterality Date   VASECTOMY      Prior to Admission medications   Medication Sig Start Date End Date Taking? Authorizing Provider  aspirin 81 MG tablet Take 81 mg by mouth daily.   Yes [provider]  cholecalciferol (VITAMIN D) 1000 UNITS tablet Take by mouth.   Yes [provider]  Cyanocobalamin (B-12) 1000 MCG SUBL Place 1 tablet under the tongue daily. 12/26/20  Yes Sowles, Drue Stager, MD  fluticasone (FLONASE) 50 MCG/ACT nasal spray Place 2 sprays into both nostrils daily. 08/30/20  Yes Sowles, Drue Stager, MD  ipratropium (ATROVENT) 0.06 % nasal spray Place 2 sprays into both nostrils 4 (four) times daily. 12/26/20  Yes Sowles, Drue Stager, MD  verapamil (CALAN-SR) 180 MG CR tablet TAKE ONE (1) TABLET EACH DAY 08/30/20  Yes Sowles, Drue Stager, MD  Krill Oil 300 MG CAPS Take by mouth.    [provider]  Thiamine Mononitrate (VITAMIN B1) 100 MG TABS Take 1 tablet by mouth once a week.    [provider]    Allergies as of 09/05/2021   (No Known Allergies)    Family History  Problem Relation Age of Onset   Dementia Mother    Hypertension Father     Social History   Socioeconomic History   Marital status: Married    Spouse name: Christian Griffith   Number of children: 2   Years of education: Not on  file   Highest education level: High school graduate  Occupational History   Occupation: Retired  Tobacco Use   Smoking status: Former    Packs/day: 1.00    Years: 40.00    Pack years: 40.00    Types: Cigarettes    Start date: 07/10/1975    Quit date: 10/14/2018    Years since quitting: 2.9   Smokeless tobacco: Never  Vaping Use   Vaping Use: Former   Start date: 05/18/2014   Quit date: 07/18/2014  Substance and Sexual Activity   Alcohol use: No    Alcohol/week: 0.0 standard drinks    Comment: used to be a heavy drinker   Drug use: No   Sexual activity: Yes    Partners: Female  Other Topics Concern   Not on file  Social History Narrative   2 children and 1 granddaughter and 1 grandson    They have a house in [place] and one at [facility] area   Social Determinants of Health   Financial Resource Strain: Low Risk    Difficulty of Paying Living Expenses: Not hard at all  Food Insecurity: No Food Insecurity   Worried About Charity fundraiser in the Last Year: Never true   [place] in the Last Year: Never true  Transportation Needs: No  Transportation Needs   Lack of Transportation (Medical): No   Lack of Transportation (Non-Medical): No  Physical Activity: Sufficiently Active   Days of Exercise per Week: 7 days   Minutes of Exercise per Session: 30 min  Stress: No Stress Concern Present   Feeling of Stress : Not at all  Social Connections: Moderately Integrated   Frequency of Communication with Friends and Family: More than three times a week   Frequency of Social Gatherings with Friends and Family: More than three times a week   Attends Religious Services: Never   Marine scientist or Organizations: Yes   Attends Music therapist: More than 4 times per year   Marital Status: Married  Human resources officer Violence: Not At Risk   Fear of Current or Ex-Partner: No   Emotionally Abused: No   Physically Abused: No   Sexually Abused: No    Review  of Systems: See HPI, otherwise negative ROS  Physical Exam: BP (!) 147/75   Pulse 67   Temp (!) 96.6 F (35.9 C) (Temporal)   Resp 18   Ht 5\' 7"  (1.702 m)   Wt 70.8 kg   SpO2 97%   BMI 24.43 kg/m  General:   Alert,  pleasant and cooperative in NAD Head:  Normocephalic and atraumatic. Neck:  Supple; no masses or thyromegaly. Lungs:  Clear throughout to auscultation, normal respiratory effort.    Heart:  +S1, +S2, Regular rate and rhythm, No edema. Abdomen:  Soft, nontender and nondistended. Normal bowel sounds, without guarding, and without rebound.   Neurologic:  Alert and  oriented x4;  grossly normal neurologically.  Impression/Plan: [name] is here for an colonoscopy to be performed for Screening colonoscopy average risk   Risks, benefits, limitations, and alternatives regarding  colonoscopy have been reviewed with the patient.  Questions have been answered.  All parties agreeable.   Christian Bellows, MD  09/11/2021, 7:44 AM

## 2021-09-11 NOTE — Transfer of Care (Signed)
Immediate Anesthesia Transfer of Care Note  Patient: Christian Griffith  Procedure(s) Performed: COLONOSCOPY WITH PROPOFOL  Patient Location: PACU and Endoscopy Unit  Anesthesia Type:General  Level of Consciousness: drowsy  Airway & Oxygen Therapy: Patient Spontanous Breathing  Post-op Assessment: Report given to RN  Post vital signs: stable  Last Vitals:  Vitals Value Taken Time  BP    Temp    Pulse    Resp    SpO2      Last Pain:  Vitals:   09/11/21 0648  TempSrc: Temporal         Complications: No notable events documented.

## 2021-09-11 NOTE — Op Note (Signed)
[facility] Gastroenterology Patient Name: Christian Griffith Procedure Date: 09/11/2021 7:46 AM MRN: [id number] Account #: [id number] Date of Birth: 1956-08-20 Admit Type: Outpatient Age: 65 Room: [facility] ENDO ROOM 2 Gender: Male Note Status: Finalized Instrument Name: Park Meo [id number] Procedure:             Colonoscopy Indications:           Screening for colorectal malignant neoplasm Providers:             Jonathon Bellows MD, MD Referring MD:          Bethena Roys. Sowles, MD (Referring MD) Medicines:             Monitored Anesthesia Care Complications:         No immediate complications. Procedure:             Pre-Anesthesia Assessment:                        - Prior to the procedure, a History and Physical was                         performed, and patient medications, allergies and                         sensitivities were reviewed. The patient's tolerance                         of previous anesthesia was reviewed.                        - The risks and benefits of the procedure and the                         sedation options and risks were discussed with the                         patient. All questions were answered and informed                         consent was obtained.                        - ASA Grade Assessment: II - A patient with mild                         systemic disease.                        After obtaining informed consent, the colonoscope was                         passed under direct vision. Throughout the procedure,                         the patient's blood pressure, pulse, and oxygen                         saturations were monitored continuously. The                         Colonoscope was  introduced through the anus and                         advanced to the the cecum, identified by the                         appendiceal orifice. The colonoscopy was performed                         with ease. The patient tolerated the procedure well.                          The quality of the bowel preparation was good. Findings:      The perianal and digital rectal examinations were normal.      Five sessile polyps were found in the recto-sigmoid colon. The polyps       were 4 to 6 mm in size. These polyps were removed with a cold snare.       Resection and retrieval were complete.      Four sessile polyps were found in the ascending colon. The polyps were 6       to 10 mm in size. These polyps were removed with a piecemeal technique       using a cold snare. Resection and retrieval were complete.      Two sessile polyps were found in the distal ascending colon. The polyps       were 15 to 20 mm in size. Preparations were made for mucosal resection.       Saline was injected to raise the lesion. Snare mucosal resection was       performed. Resection and retrieval were complete. To prevent bleeding       after mucosal resection, two hemostatic clips were successfully placed.       There was no bleeding during, or at the end, of the procedure. Borders       of the polyp marked using blue or NBI light      The exam was otherwise without abnormality on direct and retroflexion       views. Impression:            - Five 4 to 6 mm polyps at the recto-sigmoid colon,                         removed with a cold snare. Resected and retrieved.                        - Four 6 to 10 mm polyps in the ascending colon,                         removed piecemeal using a cold snare. Resected and                         retrieved.                        - Two 15 to 20 mm polyps in the distal ascending                         colon, removed with mucosal resection. Resected and  retrieved. Clips were placed.                        - The examination was otherwise normal on direct and                         retroflexion views.                        - Mucosal resection was performed. Resection and                         retrieval  were complete. Recommendation:        - Discharge patient to home (with escort).                        - Resume previous diet.                        - Continue present medications.                        - Await pathology results.                        - Repeat colonoscopy in 6 months for surveillance                         after piecemeal polypectomy. Procedure Code(s):     --- Professional ---                        [phone], Colonoscopy, flexible; with endoscopic mucosal                         resection                        [id number], 80, Colonoscopy, flexible; with removal of                         tumor(s), polyp(s), or other lesion(s) by snare                         technique Diagnosis Code(s):     --- Professional ---                        Z12.11, Encounter for screening for malignant neoplasm                         of colon                        K63.5, Polyp of colon CPT copyright 2019 American Medical Association. All rights reserved. The codes documented in this report are preliminary and upon coder review may  be revised to meet current compliance requirements. Jonathon Bellows, MD Jonathon Bellows MD, MD 09/11/2021 8:29:45 AM This report has been signed electronically. Number of Addenda: 0 Note Initiated On: 09/11/2021 7:46 AM Scope Withdrawal Time: 0 hours 30 minutes 15 seconds  Total Procedure Duration: 0 hours 34 minutes 47 seconds  Estimated Blood Loss:  Estimated blood loss: none.  [facility]

## 2021-09-12 ENCOUNTER — Encounter: Payer: Self-pay | Admitting: Gastroenterology

## 2021-09-12 LAB — SURGICAL PATHOLOGY

## 2021-09-13 NOTE — Anesthesia Postprocedure Evaluation (Signed)
Anesthesia Post Note  Patient: Christian Griffith  Procedure(s) Performed: COLONOSCOPY WITH PROPOFOL  Patient location during evaluation: PACU Anesthesia Type: General Level of consciousness: awake and alert Pain management: pain level controlled Vital Signs Assessment: post-procedure vital signs reviewed and stable Respiratory status: spontaneous breathing, nonlabored ventilation, respiratory function stable and patient connected to nasal cannula oxygen Cardiovascular status: blood pressure returned to baseline and stable Postop Assessment: no apparent nausea or vomiting Anesthetic complications: no   No notable events documented.   Last Vitals:  Vitals:   09/11/21 0837 09/11/21 0849  BP: 117/61 133/71  Pulse:    Resp:    Temp:    SpO2:      Last Pain:  Vitals:   09/12/21 0749  TempSrc:   PainSc: 0-No pain                 Molli Barrows

## 2021-09-17 ENCOUNTER — Other Ambulatory Visit: Payer: Self-pay | Admitting: Family Medicine

## 2021-09-17 DIAGNOSIS — I10 Essential (primary) hypertension: Secondary | ICD-10-CM

## 2021-12-12 ENCOUNTER — Other Ambulatory Visit: Payer: Self-pay | Admitting: Family Medicine

## 2021-12-12 ENCOUNTER — Telehealth: Payer: Self-pay | Admitting: Family Medicine

## 2021-12-12 DIAGNOSIS — I10 Essential (primary) hypertension: Secondary | ICD-10-CM

## 2021-12-12 MED ORDER — VERAPAMIL HCL ER 180 MG PO TBCR: EXTENDED_RELEASE_TABLET | ORAL | 0 refills | Status: DC

## 2021-12-13 NOTE — Telephone Encounter (Signed)
Pt wife said that they have moved and no longer are coming to [employer] .

## 2022-01-08 ENCOUNTER — Ambulatory Visit: Payer: Medicare HMO

## 2022-01-17 ENCOUNTER — Telehealth: Payer: Self-pay | Admitting: Gastroenterology

## 2022-01-17 NOTE — Telephone Encounter (Signed)
Results were faxed to [facility] to [phone]. ?

## 2022-01-17 NOTE — Telephone Encounter (Signed)
Patients spouse states that they have moved recently and are needing the results of the last colonoscopy for the patient for the new doctor. Patient spouse states that Dr Vicente Males wanted the patient to have a repeat colonoscopy in 6 months. Patient spouse states that results need to be faxed to [place]  ?Fax # [phone]  ?

## 2022-03-09 ENCOUNTER — Other Ambulatory Visit: Payer: Self-pay | Admitting: Family Medicine

## 2022-03-09 DIAGNOSIS — I10 Essential (primary) hypertension: Secondary | ICD-10-CM

## 2022-06-08 ENCOUNTER — Other Ambulatory Visit: Payer: Self-pay | Admitting: Internal Medicine

## 2022-06-08 DIAGNOSIS — I10 Essential (primary) hypertension: Secondary | ICD-10-CM

## 2022-06-10 NOTE — Telephone Encounter (Signed)
Requested medications are due for refill today.  yes  Requested medications are on the active medications list.  yes  Last refill. 03/12/2022 #90 0 refills  Future visit scheduled.   no  Notes to clinic.  Per chart pt has moved and no longer uses [facility].     Requested Prescriptions  Pending Prescriptions Disp Refills   verapamil (CALAN-SR) 180 MG CR tablet [Pharmacy Med Name: VERAPAMIL ER 180 MG TABLET] 90 tablet 0    Sig: TAKE 1 TABLET BY MOUTH EVERY DAY     Cardiovascular: Calcium Channel Blockers 3 Failed - 06/08/2022  9:02 AM      Failed - Valid encounter within last 6 months    Recent Outpatient Visits           11 months ago COPD, mild [facility])   [facility] [place], Drue Stager, MD   1 year ago COPD, mild [facility])   [facility] [place], Drue Stager, MD   1 year ago COVID-60   [facility], Drue Stager, MD   1 year ago COPD, mild [facility])   [facility] Steele Sizer, MD   2 years ago Benign essential HTN   [facility] [place], Drue Stager, MD              Passed - ALT in normal range and within 360 days    ALT  Date Value Ref Range Status  06/29/2021 14 9 - 46 U/L Final   SGPT (ALT)  Date Value Ref Range Status  01/10/2014 21 12 - 78 U/L Final         Passed - AST in normal range and within 360 days    AST  Date Value Ref Range Status  06/29/2021 16 10 - 35 U/L Final   SGOT(AST)  Date Value Ref Range Status  01/10/2014 16 15 - 37 Unit/L Final         Passed - Cr in normal range and within 360 days    Creat  Date Value Ref Range Status  06/29/2021 0.79 0.70 - 1.35 mg/dL Final         Passed - Last BP in normal range    BP Readings from Last 1 Encounters:  09/11/21 133/71         Passed - Last Heart Rate in normal range    Pulse Readings from Last 1 Encounters:  09/11/21 67

## 2022-06-28 ENCOUNTER — Telehealth: Payer: Self-pay | Admitting: Internal Medicine

## 2022-06-28 DIAGNOSIS — I10 Essential (primary) hypertension: Secondary | ICD-10-CM

## 2022-06-28 NOTE — Telephone Encounter (Signed)
Requested medications are due for refill today.  yes  Requested medications are on the active medications list.  yes  Last refill. 03/12/2022 #90 0 refills  Future visit scheduled.   no  Notes to clinic.  Pt is more than 3 months over due for OV. Labs are expired.    Requested Prescriptions  Pending Prescriptions Disp Refills   verapamil (CALAN-SR) 180 MG CR tablet [Pharmacy Med Name: VERAPAMIL ER 180 MG TABLET] 90 tablet 0    Sig: TAKE 1 TABLET BY MOUTH EVERY DAY     Cardiovascular: Calcium Channel Blockers 3 Failed - 06/28/2022 11:44 AM      Failed - ALT in normal range and within 360 days    ALT  Date Value Ref Range Status  06/29/2021 14 9 - 46 U/L Final   SGPT (ALT)  Date Value Ref Range Status  01/10/2014 21 12 - 78 U/L Final         Failed - AST in normal range and within 360 days    AST  Date Value Ref Range Status  06/29/2021 16 10 - 35 U/L Final   SGOT(AST)  Date Value Ref Range Status  01/10/2014 16 15 - 37 Unit/L Final         Failed - Cr in normal range and within 360 days    Creat  Date Value Ref Range Status  06/29/2021 0.79 0.70 - 1.35 mg/dL Final         Failed - Valid encounter within last 6 months    Recent Outpatient Visits           12 months ago COPD, mild [facility])   [facility] Steele Sizer, MD   1 year ago COPD, mild [facility])   [facility] Steele Sizer, MD   1 year ago COVID-21   [facility] Steele Sizer, MD   1 year ago COPD, mild [facility])   [facility] Steele Sizer, MD   2 years ago Benign essential HTN   [facility] [place], Drue Stager, MD              Passed - Last BP in normal range    BP Readings from Last 1 Encounters:  09/11/21 133/71         Passed - Last Heart Rate in normal range    Pulse Readings from Last 1 Encounters:  09/11/21 67

## 2022-07-01 NOTE — Telephone Encounter (Signed)
Lvm for pt to schedule an appt
